# Patient Record
Sex: Female | Born: 1935 | Race: White | Hispanic: No | Smoking: Never smoker
Health system: Southern US, Community
[De-identification: ages and names within clinical notes are randomized; demographics above are authoritative.]

## PROBLEM LIST (undated history)

## (undated) DIAGNOSIS — D649 Anemia, unspecified: Secondary | ICD-10-CM

## (undated) DIAGNOSIS — M069 Rheumatoid arthritis, unspecified: Secondary | ICD-10-CM

## (undated) DIAGNOSIS — I10 Essential (primary) hypertension: Secondary | ICD-10-CM

## (undated) DIAGNOSIS — K429 Umbilical hernia without obstruction or gangrene: Secondary | ICD-10-CM

## (undated) DIAGNOSIS — H3530 Unspecified macular degeneration: Secondary | ICD-10-CM

## (undated) DIAGNOSIS — N200 Calculus of kidney: Secondary | ICD-10-CM

## (undated) DIAGNOSIS — E119 Type 2 diabetes mellitus without complications: Secondary | ICD-10-CM

## (undated) DIAGNOSIS — Q600 Renal agenesis, unilateral: Secondary | ICD-10-CM

## (undated) DIAGNOSIS — N289 Disorder of kidney and ureter, unspecified: Secondary | ICD-10-CM

## (undated) HISTORY — DX: Type 2 diabetes mellitus without complications: E11.9

## (undated) HISTORY — PX: OTHER SURGICAL HISTORY: SHX169

## (undated) HISTORY — DX: Renal agenesis, unilateral: Q60.0

## (undated) HISTORY — DX: Umbilical hernia without obstruction or gangrene: K42.9

## (undated) HISTORY — DX: Rheumatoid arthritis, unspecified: M06.9

## (undated) HISTORY — DX: Unspecified macular degeneration: H35.30

## (undated) HISTORY — DX: Disorder of kidney and ureter, unspecified: N28.9

## (undated) HISTORY — DX: Essential (primary) hypertension: I10

## (undated) HISTORY — DX: Anemia, unspecified: D64.9

## (undated) HISTORY — DX: Calculus of kidney: N20.0

---

## 2001-05-20 LAB — HM MAMMOGRAPHY

## 2004-05-27 ENCOUNTER — Ambulatory Visit: Payer: Self-pay | Admitting: Family Medicine

## 2004-07-29 ENCOUNTER — Ambulatory Visit: Payer: Self-pay | Admitting: Family Medicine

## 2004-09-16 ENCOUNTER — Ambulatory Visit: Payer: Self-pay | Admitting: Family Medicine

## 2004-11-03 ENCOUNTER — Ambulatory Visit: Payer: Self-pay | Admitting: Family Medicine

## 2004-12-17 ENCOUNTER — Ambulatory Visit: Payer: Self-pay | Admitting: Family Medicine

## 2004-12-29 ENCOUNTER — Ambulatory Visit: Payer: Self-pay | Admitting: Family Medicine

## 2005-02-17 ENCOUNTER — Ambulatory Visit: Payer: Self-pay | Admitting: Family Medicine

## 2005-05-06 ENCOUNTER — Ambulatory Visit: Payer: Self-pay | Admitting: Family Medicine

## 2005-12-02 ENCOUNTER — Ambulatory Visit: Payer: Self-pay | Admitting: Family Medicine

## 2005-12-03 ENCOUNTER — Encounter: Payer: Self-pay | Admitting: Family Medicine

## 2005-12-03 LAB — CONVERTED CEMR LAB: Hgb A1c MFr Bld: 6.1 %

## 2006-01-05 ENCOUNTER — Ambulatory Visit: Payer: Self-pay | Admitting: Family Medicine

## 2006-02-04 ENCOUNTER — Ambulatory Visit: Payer: Self-pay | Admitting: Family Medicine

## 2006-05-05 ENCOUNTER — Ambulatory Visit: Payer: Self-pay | Admitting: Family Medicine

## 2007-04-08 ENCOUNTER — Encounter: Payer: Self-pay | Admitting: Family Medicine

## 2007-04-08 DIAGNOSIS — E1140 Type 2 diabetes mellitus with diabetic neuropathy, unspecified: Secondary | ICD-10-CM

## 2007-04-08 DIAGNOSIS — H3530 Unspecified macular degeneration: Secondary | ICD-10-CM | POA: Insufficient documentation

## 2007-04-08 DIAGNOSIS — E119 Type 2 diabetes mellitus without complications: Secondary | ICD-10-CM

## 2007-04-08 DIAGNOSIS — Z87442 Personal history of urinary calculi: Secondary | ICD-10-CM

## 2007-04-08 DIAGNOSIS — M7500 Adhesive capsulitis of unspecified shoulder: Secondary | ICD-10-CM

## 2007-04-08 DIAGNOSIS — R32 Unspecified urinary incontinence: Secondary | ICD-10-CM | POA: Insufficient documentation

## 2007-04-08 DIAGNOSIS — E7800 Pure hypercholesterolemia, unspecified: Secondary | ICD-10-CM

## 2007-04-08 DIAGNOSIS — E1121 Type 2 diabetes mellitus with diabetic nephropathy: Secondary | ICD-10-CM

## 2007-04-08 DIAGNOSIS — I10 Essential (primary) hypertension: Secondary | ICD-10-CM

## 2007-04-08 DIAGNOSIS — M069 Rheumatoid arthritis, unspecified: Secondary | ICD-10-CM | POA: Insufficient documentation

## 2007-04-12 ENCOUNTER — Ambulatory Visit: Payer: Self-pay | Admitting: Family Medicine

## 2007-05-02 ENCOUNTER — Telehealth: Payer: Self-pay | Admitting: Family Medicine

## 2007-05-02 LAB — CONVERTED CEMR LAB
ALT: 15 units/L (ref 0–35)
AST: 18 units/L (ref 0–37)
CO2: 30 meq/L (ref 19–32)
Calcium: 9.4 mg/dL (ref 8.4–10.5)
Direct LDL: 185.6 mg/dL
Eosinophils Absolute: 0.2 10*3/uL (ref 0.0–0.6)
Eosinophils Relative: 3.3 % (ref 0.0–5.0)
Glucose, Bld: 156 mg/dL — ABNORMAL HIGH (ref 70–99)
HCT: 32.2 % — ABNORMAL LOW (ref 36.0–46.0)
Hemoglobin: 11.2 g/dL — ABNORMAL LOW (ref 12.0–15.0)
Lymphocytes Relative: 21.3 % (ref 12.0–46.0)
MCHC: 34.8 g/dL (ref 30.0–36.0)
MCV: 91.3 fL (ref 78.0–100.0)
Microalb, Ur: 0.6 mg/dL (ref 0.0–1.9)
Monocytes Absolute: 0.6 10*3/uL (ref 0.2–0.7)
Neutro Abs: 4.9 10*3/uL (ref 1.4–7.7)
Neutrophils Relative %: 66.4 % (ref 43.0–77.0)
Phosphorus: 4.6 mg/dL (ref 2.3–4.6)
Potassium: 4.6 meq/L (ref 3.5–5.1)
Sodium: 143 meq/L (ref 135–145)
WBC: 7 10*3/uL (ref 4.5–10.5)

## 2007-05-20 ENCOUNTER — Ambulatory Visit: Payer: Self-pay | Admitting: Family Medicine

## 2007-06-07 ENCOUNTER — Ambulatory Visit: Payer: Self-pay | Admitting: Family Medicine

## 2007-06-09 ENCOUNTER — Encounter (INDEPENDENT_AMBULATORY_CARE_PROVIDER_SITE_OTHER): Payer: Self-pay | Admitting: *Deleted

## 2007-07-05 ENCOUNTER — Ambulatory Visit: Payer: Self-pay | Admitting: Family Medicine

## 2007-07-05 DIAGNOSIS — D649 Anemia, unspecified: Secondary | ICD-10-CM

## 2007-07-06 LAB — CONVERTED CEMR LAB
Basophils Absolute: 0 10*3/uL (ref 0.0–0.1)
Eosinophils Absolute: 0.2 10*3/uL (ref 0.0–0.6)
Eosinophils Relative: 3.6 % (ref 0.0–5.0)
Folate: 11.5 ng/mL
Iron: 58 ug/dL (ref 42–145)
MCV: 90.7 fL (ref 78.0–100.0)
Platelets: 113 10*3/uL — ABNORMAL LOW (ref 150–400)
RBC: 3.45 M/uL — ABNORMAL LOW (ref 3.87–5.11)
RDW: 13.6 % (ref 11.5–14.6)
WBC: 4.7 10*3/uL (ref 4.5–10.5)

## 2007-07-07 ENCOUNTER — Ambulatory Visit: Payer: Self-pay | Admitting: Oncology

## 2007-08-17 LAB — CBC WITH DIFFERENTIAL (CANCER CENTER ONLY)
BASO%: 0.4 % (ref 0.0–2.0)
HCT: 32.2 % — ABNORMAL LOW (ref 34.8–46.6)
LYMPH#: 1.5 10*3/uL (ref 0.9–3.3)
MONO#: 0.5 10*3/uL (ref 0.1–0.9)
NEUT#: 3.1 10*3/uL (ref 1.5–6.5)
Platelets: 138 10*3/uL — ABNORMAL LOW (ref 145–400)
RDW: 12.7 % (ref 10.5–14.6)
WBC: 5.3 10*3/uL (ref 3.9–10.0)

## 2007-08-23 ENCOUNTER — Encounter: Payer: Self-pay | Admitting: Family Medicine

## 2007-08-23 ENCOUNTER — Ambulatory Visit: Payer: Self-pay | Admitting: Ophthalmology

## 2007-08-23 ENCOUNTER — Other Ambulatory Visit: Payer: Self-pay

## 2007-09-06 ENCOUNTER — Ambulatory Visit: Payer: Self-pay | Admitting: Ophthalmology

## 2007-10-12 ENCOUNTER — Ambulatory Visit: Payer: Self-pay | Admitting: Ophthalmology

## 2007-10-18 ENCOUNTER — Ambulatory Visit: Payer: Self-pay | Admitting: Ophthalmology

## 2007-11-11 ENCOUNTER — Ambulatory Visit: Payer: Self-pay | Admitting: Oncology

## 2007-11-14 ENCOUNTER — Encounter: Payer: Self-pay | Admitting: Family Medicine

## 2007-11-14 LAB — BASIC METABOLIC PANEL
BUN: 26 mg/dL — ABNORMAL HIGH (ref 6–23)
CO2: 23 mEq/L (ref 19–32)
Chloride: 107 mEq/L (ref 96–112)
Glucose, Bld: 86 mg/dL (ref 70–99)
Potassium: 4.4 mEq/L (ref 3.5–5.3)

## 2007-11-14 LAB — CBC WITH DIFFERENTIAL (CANCER CENTER ONLY)
BASO%: 0.4 % (ref 0.0–2.0)
EOS%: 3.6 % (ref 0.0–7.0)
LYMPH%: 29.9 % (ref 14.0–48.0)
MCV: 88 fL (ref 81–101)
MONO#: 0.5 10*3/uL (ref 0.1–0.9)
MONO%: 9.3 % (ref 0.0–13.0)
Platelets: 147 10*3/uL (ref 145–400)
RDW: 13.4 % (ref 10.5–14.6)
WBC: 5.2 10*3/uL (ref 3.9–10.0)

## 2007-11-22 ENCOUNTER — Ambulatory Visit: Payer: Self-pay | Admitting: Family Medicine

## 2007-11-22 DIAGNOSIS — R220 Localized swelling, mass and lump, head: Secondary | ICD-10-CM

## 2007-11-22 DIAGNOSIS — R221 Localized swelling, mass and lump, neck: Secondary | ICD-10-CM

## 2007-11-28 LAB — CONVERTED CEMR LAB
Albumin: 3.8 g/dL (ref 3.5–5.2)
Chloride: 109 meq/L (ref 96–112)
GFR calc Af Amer: 51 mL/min
GFR calc non Af Amer: 42 mL/min
Hgb A1c MFr Bld: 5.6 % (ref 4.6–6.0)
Potassium: 4.6 meq/L (ref 3.5–5.1)
TSH: 0.56 microintl units/mL (ref 0.35–5.50)
Total Protein: 6.8 g/dL (ref 6.0–8.3)

## 2008-04-16 ENCOUNTER — Ambulatory Visit: Payer: Self-pay | Admitting: Family Medicine

## 2008-05-16 ENCOUNTER — Ambulatory Visit: Payer: Self-pay | Admitting: Oncology

## 2008-05-17 ENCOUNTER — Encounter: Payer: Self-pay | Admitting: Family Medicine

## 2008-05-17 LAB — CBC WITH DIFFERENTIAL (CANCER CENTER ONLY)
Eosinophils Absolute: 0.2 10*3/uL (ref 0.0–0.5)
LYMPH%: 30.5 % (ref 14.0–48.0)
MCH: 31.5 pg (ref 26.0–34.0)
MCV: 91 fL (ref 81–101)
MONO%: 8.3 % (ref 0.0–13.0)
Platelets: 122 10*3/uL — ABNORMAL LOW (ref 145–400)
RBC: 3.65 10*6/uL — ABNORMAL LOW (ref 3.70–5.32)
RDW: 12.8 % (ref 10.5–14.6)

## 2008-06-06 ENCOUNTER — Ambulatory Visit: Payer: Self-pay | Admitting: Family Medicine

## 2008-06-07 LAB — CONVERTED CEMR LAB
AST: 21 units/L (ref 0–37)
BUN: 26 mg/dL — ABNORMAL HIGH (ref 6–23)
Chloride: 108 meq/L (ref 96–112)
Cholesterol: 195 mg/dL (ref 0–200)
Glucose, Bld: 133 mg/dL — ABNORMAL HIGH (ref 70–99)
Hgb A1c MFr Bld: 5.5 % (ref 4.6–6.0)
Phosphorus: 4.8 mg/dL — ABNORMAL HIGH (ref 2.3–4.6)
Potassium: 4.5 meq/L (ref 3.5–5.1)

## 2008-07-02 ENCOUNTER — Encounter: Payer: Self-pay | Admitting: Family Medicine

## 2008-07-26 ENCOUNTER — Telehealth (INDEPENDENT_AMBULATORY_CARE_PROVIDER_SITE_OTHER): Payer: Self-pay | Admitting: *Deleted

## 2008-07-26 ENCOUNTER — Encounter: Payer: Self-pay | Admitting: Family Medicine

## 2008-08-30 ENCOUNTER — Telehealth: Payer: Self-pay | Admitting: Internal Medicine

## 2008-09-07 ENCOUNTER — Ambulatory Visit: Payer: Self-pay | Admitting: Family Medicine

## 2008-09-11 LAB — CONVERTED CEMR LAB
ALT: 13 units/L (ref 0–35)
AST: 19 units/L (ref 0–37)
CO2: 29 meq/L (ref 19–32)
Chloride: 107 meq/L (ref 96–112)
Cholesterol: 187 mg/dL (ref 0–200)
Creatinine, Ser: 1.2 mg/dL (ref 0.4–1.2)
GFR calc Af Amer: 56 mL/min
Total CHOL/HDL Ratio: 4.9
Triglycerides: 106 mg/dL (ref 0–149)

## 2008-10-01 ENCOUNTER — Encounter: Payer: Self-pay | Admitting: Family Medicine

## 2008-12-05 ENCOUNTER — Ambulatory Visit: Payer: Self-pay | Admitting: Family Medicine

## 2008-12-05 DIAGNOSIS — I8390 Asymptomatic varicose veins of unspecified lower extremity: Secondary | ICD-10-CM

## 2009-03-14 ENCOUNTER — Encounter: Payer: Self-pay | Admitting: Family Medicine

## 2009-05-14 ENCOUNTER — Ambulatory Visit: Payer: Self-pay | Admitting: Family Medicine

## 2009-09-03 ENCOUNTER — Ambulatory Visit: Payer: Self-pay | Admitting: Family Medicine

## 2009-09-06 LAB — CONVERTED CEMR LAB
ALT: 11 units/L (ref 0–35)
Albumin: 3.7 g/dL (ref 3.5–5.2)
Basophils Relative: 0.8 % (ref 0.0–3.0)
CO2: 28 meq/L (ref 19–32)
Calcium: 9.2 mg/dL (ref 8.4–10.5)
Chloride: 107 meq/L (ref 96–112)
Cholesterol: 198 mg/dL (ref 0–200)
Creatinine,U: 201.6 mg/dL
Eosinophils Absolute: 0.2 10*3/uL (ref 0.0–0.7)
Eosinophils Relative: 3.9 % (ref 0.0–5.0)
Glucose, Bld: 141 mg/dL — ABNORMAL HIGH (ref 70–99)
HCT: 34.9 % — ABNORMAL LOW (ref 36.0–46.0)
Hemoglobin: 11.9 g/dL — ABNORMAL LOW (ref 12.0–15.0)
MCHC: 34.1 g/dL (ref 30.0–36.0)
MCV: 94.3 fL (ref 78.0–100.0)
Microalb, Ur: 1.5 mg/dL (ref 0.0–1.9)
Monocytes Absolute: 0.5 10*3/uL (ref 0.1–1.0)
Neutro Abs: 3.6 10*3/uL (ref 1.4–7.7)
Potassium: 4.4 meq/L (ref 3.5–5.1)
RBC: 3.7 M/uL — ABNORMAL LOW (ref 3.87–5.11)
TSH: 0.46 microintl units/mL (ref 0.35–5.50)
Total Protein: 7 g/dL (ref 6.0–8.3)
VLDL: 38.8 mg/dL (ref 0.0–40.0)

## 2009-09-09 ENCOUNTER — Telehealth: Payer: Self-pay | Admitting: Family Medicine

## 2009-09-09 ENCOUNTER — Ambulatory Visit: Payer: Self-pay | Admitting: Family Medicine

## 2009-09-09 LAB — CONVERTED CEMR LAB
Bilirubin Urine: NEGATIVE
Bilirubin Urine: NEGATIVE
Blood in Urine, dipstick: NEGATIVE
Blood in Urine, dipstick: NEGATIVE
Glucose, Urine, Semiquant: NEGATIVE
Glucose, Urine, Semiquant: NEGATIVE
Ketones, urine, test strip: NEGATIVE
Nitrite: NEGATIVE
Specific Gravity, Urine: 1.01
Urobilinogen, UA: 0.2
Urobilinogen, UA: 0.2
Yeast, UA: 0
pH: 6.5
pH: 6

## 2009-09-13 ENCOUNTER — Encounter: Payer: Self-pay | Admitting: Family Medicine

## 2009-09-24 ENCOUNTER — Ambulatory Visit: Payer: Self-pay | Admitting: Family Medicine

## 2009-09-24 DIAGNOSIS — N289 Disorder of kidney and ureter, unspecified: Secondary | ICD-10-CM | POA: Insufficient documentation

## 2009-09-24 LAB — CONVERTED CEMR LAB
Bacteria, UA: 0
Bilirubin Urine: NEGATIVE
Blood in Urine, dipstick: NEGATIVE
Casts: 0 /lpf
Ketones, urine, test strip: NEGATIVE
Nitrite: NEGATIVE
Protein, U semiquant: NEGATIVE
RBC / HPF: 0

## 2009-09-25 ENCOUNTER — Encounter: Payer: Self-pay | Admitting: Family Medicine

## 2009-09-25 LAB — CONVERTED CEMR LAB
Albumin: 3.9 g/dL (ref 3.5–5.2)
BUN: 21 mg/dL (ref 6–23)
Calcium: 9.2 mg/dL (ref 8.4–10.5)
Creatinine, Ser: 1.6 mg/dL — ABNORMAL HIGH (ref 0.4–1.2)
Glucose, Bld: 176 mg/dL — ABNORMAL HIGH (ref 70–99)
Phosphorus: 4.4 mg/dL (ref 2.3–4.6)
Potassium: 4.4 meq/L (ref 3.5–5.1)

## 2009-09-26 ENCOUNTER — Telehealth: Payer: Self-pay | Admitting: Family Medicine

## 2009-10-09 ENCOUNTER — Ambulatory Visit: Payer: Self-pay | Admitting: Family Medicine

## 2009-10-10 LAB — CONVERTED CEMR LAB: Creatinine, Ser: 1.5 mg/dL — ABNORMAL HIGH (ref 0.4–1.2)

## 2009-11-04 ENCOUNTER — Encounter: Payer: Self-pay | Admitting: Family Medicine

## 2009-11-05 ENCOUNTER — Encounter: Payer: Self-pay | Admitting: Cardiovascular Disease

## 2009-11-14 ENCOUNTER — Ambulatory Visit: Payer: Self-pay

## 2009-11-14 ENCOUNTER — Encounter: Payer: Self-pay | Admitting: Family Medicine

## 2009-11-14 ENCOUNTER — Encounter (INDEPENDENT_AMBULATORY_CARE_PROVIDER_SITE_OTHER): Payer: Self-pay | Admitting: Nephrology

## 2009-11-14 DIAGNOSIS — R600 Localized edema: Secondary | ICD-10-CM | POA: Insufficient documentation

## 2009-12-03 ENCOUNTER — Encounter: Payer: Self-pay | Admitting: Family Medicine

## 2010-04-30 ENCOUNTER — Ambulatory Visit: Payer: Self-pay | Admitting: Family Medicine

## 2010-05-26 ENCOUNTER — Ambulatory Visit: Payer: Self-pay | Admitting: Family Medicine

## 2010-05-27 LAB — CONVERTED CEMR LAB
AST: 20 units/L (ref 0–37)
Albumin: 3.8 g/dL (ref 3.5–5.2)
BUN: 29 mg/dL — ABNORMAL HIGH (ref 6–23)
Basophils Absolute: 0 10*3/uL (ref 0.0–0.1)
Chloride: 104 meq/L (ref 96–112)
Creatinine, Ser: 1.5 mg/dL — ABNORMAL HIGH (ref 0.4–1.2)
Eosinophils Absolute: 0.2 10*3/uL (ref 0.0–0.7)
GFR calc non Af Amer: 35.72 mL/min (ref >60)
Glucose, Bld: 186 mg/dL — ABNORMAL HIGH (ref 70–99)
HDL: 52.2 mg/dL (ref >39.00)
Hgb A1c MFr Bld: 5.9 % (ref 4.6–6.5)
Lymphocytes Relative: 26.8 % (ref 12.0–46.0)
MCHC: 35.3 g/dL (ref 30.0–36.0)
Monocytes Relative: 8.8 % (ref 3.0–12.0)
Neutrophils Relative %: 60.5 % (ref 43.0–77.0)
Phosphorus: 4 mg/dL (ref 2.3–4.6)
Platelets: 123 10*3/uL — ABNORMAL LOW (ref 150.0–400.0)
Potassium: 4.4 meq/L (ref 3.5–5.1)
RBC: 3.51 M/uL — ABNORMAL LOW (ref 3.87–5.11)
RDW: 13.9 % (ref 11.5–14.6)
TSH: 0.48 microintl units/mL (ref 0.35–5.50)
Total CHOL/HDL Ratio: 4
Triglycerides: 142 mg/dL (ref 0.0–149.0)
VLDL: 28.4 mg/dL (ref 0.0–40.0)

## 2010-06-16 ENCOUNTER — Encounter: Payer: Self-pay | Admitting: Family Medicine

## 2010-08-20 NOTE — Assessment & Plan Note (Signed)
Summary: F/U,REFILL MEDS/CLE   Vital Signs:  Patient profile:   77 year old female Height:      62 inches Weight:      192.50 pounds BMI:     35.34 Temp:     98.1 degrees F oral Pulse rate:   80 / minute Pulse rhythm:   regular BP sitting:   126 / 68  (left arm) Cuff size:   large  Vitals Entered By: Lewanda Rife LPN (May 26, 2010 2:11 PM) CC: refill meds   History of Present Illness: here for f/u of HTN and renal insuff/ cholesterol   has been feeling ok - overall , nothing new   has seen renal and now cr is down to 1/28 gfr is 40- so recommended she could re start the metformin  has small L kidney and likely some vasc renal dz she started back on that  she saw Dr Thedore Mins -- and they did not like him -- unprofessional office conduct  she refuses to go back there  if she has to go somewhere else it would have to be [place]   has checked her sugars 2-3 times -- and they were    eats whatever she fixes - does not really watch diet because she does not have the time or energy to do so    has anemia of chronic kidney dz and they are watching that   HTN not well controlled there-- apprehensive to inc med to to orthostasis bp is much better today, however   april nl eye exam [facility]      Allergies: 1)  ! Metformin Hcl 2)  ! 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 Eyes:  Denies blurring and eye irritation. ENT:  Denies earache and postnasal drainage. CV:  Denies chest pain or discomfort, lightheadness, palpitations, and shortness of breath with exertion. Resp:  Denies cough, shortness of breath, and wheezing. GI:  Denies abdominal pain, change in bowel habits, indigestion, nausea, and vomiting. GU:  Denies dysuria and urinary frequency. MS:  Complains of joint pain and low back pain; denies muscle aches and cramps. Derm:  Denies itching, lesion(s), poor wound healing, and rash. Neuro:  Complains of tingling; denies numbness. Psych:  very stressed caring for her husband . Endo:  Denies cold intolerance, excessive thirst, excessive urination, and heat intolerance. Heme:  Denies abnormal bruising and bleeding.  Physical Exam  General:  obese and well appearing  Head:  normocephalic, atraumatic, and no abnormalities observed.   Eyes:  vision grossly intact, pupils equal, pupils round, and  pupils reactive to light.  no conjunctival pallor, injection or icterus  Nose:  no nasal discharge.   Mouth:  pharynx pink and moist.   Neck:  neck mass on R is larger and slt tender no other acute changes no JVD or bruits  Chest Wall:  No deformities, masses, or tenderness noted. Lungs:  Normal respiratory effort, chest expands symmetrically. Lungs are clear to auscultation, no crackles or wheezes. Heart:  Normal rate and regular rhythm. S1 and S2 normal without gallop, murmur, click, rub or other extra sounds. Abdomen:  protuberant soft/nt and no renal bruits  Pulses:   R and L carotid,radial,femoral,dorsalis pedis and posterior tibial pulses are full and equal bilaterally Extremities:  No clubbing, cyanosis, edema, or deformity noted with normal full range of motion of all joints.   Neurologic:  sensation intact to light touch, gait normal, and DTRs symmetrical and normal.   Skin:  Intact without suspicious lesions or rashes Cervical Nodes:  no changes  Psych:  normal affect, talkative and pleasant   Diabetes Management Exam:    Foot Exam (with socks and/or shoes not present):       Sensory-Pinprick/Light touch:          Left medial foot (L-4): normal          Left dorsal foot (L-5): normal          Left lateral foot (S-1): normal          Right medial foot (L-4): normal          Right dorsal foot (L-5): normal          Right lateral foot (S-1): normal       Sensory-Monofilament:          Left foot: normal          Right foot: normal       Inspection:          Left foot: normal          Right foot: normal       Nails:          Left foot: normal          Right foot: normal    Eye Exam:       Eye Exam done elsewhere          Date: 10/18/2009          Results: normal          Done by: [facility]   Impression & Recommendations:  Problem # 1:  EDEMA (ICD-782.3) Assessment Improved  pt is back on just 12.5 of hct and doing well - was ok'd by renal  lab today Her updated medication list for this problem includes:    Hyzaar 100-12.5 Mg Tabs (Losartan potassium-hctz) .Marland Kitchen... 1 by mouth once daily  Discussed elevation of the legs, use of compression stockings, sodium restiction, and medication use.   Problem # 2:  RENAL INSUFFICIENCY (ICD-588.9) Assessment: Improved was put back on metformin due to imp gfr pt , however does not want to return to Dr Thedore Mins  if cr rises again - req ref in [place] -- that is ok  det that renal dz is age rel and also poss reno- vasc dz, also congenital small kidney lab today Orders: Venipuncture  (29562) TLB-Lipid Panel (80061-LIPID) TLB-Renal Function Panel (80069-RENAL) TLB-CBC Platelet - w/Differential (85025-CBCD) TLB-ALT (SGPT) (84460-ALT) TLB-AST (SGOT) (84450-SGOT) TLB-TSH (Thyroid Stimulating Hormone) (84443-TSH) TLB-A1C / Hgb A1C (Glycohemoglobin) (83036-A1C)  Problem #  3:  UNSPECIFIED ANEMIA (ICD-285.9) Assessment: Unchanged from kidney dz lab today Her updated medication list for this problem includes:    Folic Acid 1 Mg Tabs (Folic acid) .Marland Kitchen... 1 by mouth once daily  Orders: Venipuncture (86578) TLB-Lipid Panel (80061-LIPID) TLB-Renal Function Panel (80069-RENAL) TLB-CBC Platelet - w/Differential (85025-CBCD) TLB-ALT (SGPT) (84460-ALT) TLB-AST (SGOT) (84450-SGOT) TLB-TSH (Thyroid Stimulating Hormone) (84443-TSH) TLB-A1C / Hgb A1C (Glycohemoglobin) (83036-A1C)  Problem # 4:  DIABETES MELLITUS, TYPE II (ICD-250.00) Assessment: Unchanged hope for imp back on metformin enc pt to eat healthy diet and find time to check sugar once weekly she explains she simply cannot caring for demented husband lab today f/u 6 mo  opthy utd  Her updated medication list for this problem includes:    Hyzaar 100-12.5 Mg Tabs (Losartan potassium-hctz) .Marland Kitchen... 1 by mouth once daily    Metformin Hcl 500 Mg Tabs (Metformin hcl) .Marland Kitchen..Marland Kitchen Two tablets by mouth twice a day  Orders: Venipuncture (46962) TLB-Lipid Panel (80061-LIPID) TLB-Renal Function Panel (80069-RENAL) TLB-CBC Platelet - w/Differential (85025-CBCD) TLB-ALT (SGPT) (84460-ALT) TLB-AST (SGOT) (84450-SGOT) TLB-TSH (Thyroid Stimulating Hormone) (84443-TSH) TLB-A1C / Hgb A1C (Glycohemoglobin) (83036-A1C)  Problem # 5:  HYPERTENSION (ICD-401.9) Assessment: Improved  bp is much imp today -- continue to follow (was high in renal office) Her updated medication list for this problem includes:    Hyzaar 100-12.5 Mg Tabs (Losartan potassium-hctz) .Marland Kitchen... 1 by mouth once daily  Orders: Venipuncture (95284) TLB-Lipid Panel  (80061-LIPID) TLB-Renal Function Panel (80069-RENAL) TLB-CBC Platelet - w/Differential (85025-CBCD) TLB-ALT (SGPT) (84460-ALT) TLB-AST (SGOT) (84450-SGOT) TLB-TSH (Thyroid Stimulating Hormone) (84443-TSH) TLB-A1C / Hgb A1C (Glycohemoglobin) (83036-A1C)  BP today: 126/68 Prior BP: 118/64 (10/09/2009)  Labs Reviewed: K+: 4.4 (09/24/2009) Creat: : 1.5 (10/09/2009)   Chol: 198 (09/03/2009)   HDL: 45.20 (09/03/2009)   LDL: 114 (09/03/2009)   TG: 194.0 (09/03/2009)  Complete Medication List: 1)  Gabapentin 300 Mg Caps (Gabapentin) .... Take two capsules two  times a day 2)  Zetia 10 Mg Tabs (Ezetimibe) .Marland Kitchen.. 1 by mouth once daily 3)  Detrol La 4 Mg Cp24 (Tolterodine tartrate) .Marland Kitchen.. 1 by mouth once daily 4)  Folic Acid 1 Mg Tabs (Folic acid) .Marland Kitchen.. 1 by mouth once daily 5)  Advil 200 Mg Caps (Ibuprofen) .... Once daily 6)  Contour Blood Glucose System Devi (Blood glucose monitoring suppl) .... To check glucose once daily and as needed 7)  Bayer Contour Test Strp (Glucose blood) .... Check blood sugar once daily and as needed dx: 250.52 8)  B Complex Vitamins Caps (B complex vitamins) .... Once daily 9)  Hyzaar 100-12.5 Mg Tabs (Losartan potassium-hctz) .Marland Kitchen.. 1 by mouth once daily 10)  Metformin Hcl 500 Mg Tabs (Metformin hcl) .... Two tablets by mouth twice a day 11)  Vitamin D 2000 Unit Tabs (Cholecalciferol) .... Take 1 tablet by mouth once a day 12)  Iron ?mg  .... Take 1 tablet by mouth once a day  Patient Instructions: 1)  try to stick to a diabetic diet the best you can  2)  check sugar once per week at different times 3)  ask for help caring for your husband when needed  4)  labs today  5)  no change in medicines  6)  blood pressure is better today 7)  follow up in about 6 months  Prescriptions: HYZAAR 100-12.5 MG TABS (LOSARTAN POTASSIUM-HCTZ) 1 by mouth once daily  #90 x 3   Entered and Authorized by:   Judith Part MD   Signed by:   [employer]  Rose Fillers MD on 05/26/2010   Method  used:   Print then Give to Patient   RxID:   [id number] METFORMIN HCL 500 MG TABS (METFORMIN HCL) two tablets by mouth twice a day  #3 months x 3   Entered and Authorized by:   Judith Part MD   Signed by:   Judith Part MD on 05/26/2010   Method used:   Print then Give to Patient   RxID:   [id number] COZAAR 100 MG TABS (LOSARTAN POTASSIUM) take one tablet by mouth daily  #90 x 3   Entered and Authorized by:   Judith Part MD   Signed by:   Judith Part MD on 05/26/2010   Method used:   Print then Give to Patient   RxID:   [id number] FOLIC ACID 1 MG  TABS (FOLIC ACID) 1 by mouth once daily  #90 x 3   Entered and Authorized by:   Judith Part MD   Signed by:   Judith Part MD on 05/26/2010   Method used:   Print then Give to Patient   RxID:   [id number] DETROL LA 4 MG  CP24 (TOLTERODINE TARTRATE) 1 by mouth once daily  #90 x 3   Entered and Authorized by:   Judith Part MD   Signed by:   Judith Part MD on 05/26/2010   Method used:   Print then Give to Patient   RxID:   [id number] ZETIA 10 MG  TABS (EZETIMIBE) 1 by mouth once daily  #90 x 3   Entered and Authorized by:   Judith Part MD   Signed by:   Judith Part MD on 05/26/2010   Method used:   Print then Give to Patient   RxID:   [id number] GABAPENTIN 300 MG CAPS (GABAPENTIN) take two capsules two  times a day  #3 months x 3   Entered and Authorized by:   Judith Part MD   Signed by:   Judith Part MD on 05/26/2010   Method used:   Print then Give to Patient   RxID:   [id number]    Orders Added: 1)  Venipuncture [70623] 2)  TLB-Lipid Panel [80061-LIPID] 3)  TLB-Renal Function Panel [80069-RENAL] 4)  TLB-CBC Platelet - w/Differential [85025-CBCD] 5)  TLB-ALT (SGPT) [84460-ALT] 6)  TLB-AST (SGOT) [84450-SGOT] 7)  TLB-TSH (Thyroid Stimulating Hormone) [84443-TSH] 8)  TLB-A1C / Hgb A1C (Glycohemoglobin) [83036-A1C] 9)  Est. Patient Level IV  [76283]    Current Allergies (reviewed today): ! METFORMIN HCL ! * GLIPIZIDE LOPRESSOR ACE INHIBITORS * DETROL LIPITOR DARVOCET LODINE * CRESTOR

## 2010-08-20 NOTE — Assessment & Plan Note (Signed)
Summary: urine sample/rbh  Nurse Visit   Allergies: 1)  Lopressor 2)  Ace Inhibitors 3)  * Detrol 4)  Lipitor 5)  Darvocet 6)  Lodine 7)  * Crestor Laboratory Results   Urine Tests  Date/Time Received: September 09, 2009 3:15 PM  Date/Time Reported: September 09, 2009 3:15 PM   Routine Urinalysis   Color: yellow Appearance: slightly hazy Glucose: negative   (Normal Range: Negative) Bilirubin: negative   (Normal Range: Negative) Ketone: negative   (Normal Range: Negative) Spec. Gravity: 1.010   (Normal Range: 1.003-1.035) Blood: negative   (Normal Range: Negative) pH: 6.5   (Normal Range: 5.0-8.0) Protein: trace   (Normal Range: Negative) Urobilinogen: 0.2   (Normal Range: 0-1) Nitrite: negative   (Normal Range: Negative) Leukocyte Esterace: small   (Normal Range: Negative)       Orders Added: 1)  Specimen Handling [99000]   Dr. Milinda Antis requested pt to have a urinalysis done from lab results note on 09/03/09. Creatinine was up and pt has been drinking more water. Can call pt's daughter Ian Malkin [phone] with results of u/a. Lewanda Rife LPN  September 09, 2009 3:19 PM   Did phone note to Dr Milinda Antis. Dr. Milinda Antis sending for urine culture and will notify Ms. Newsome upon results.Patient's daughter Ian Malkin notified as instructed by telephone. Lewanda Rife LPN  September 09, 2009 4:33 PM

## 2010-08-20 NOTE — Miscellaneous (Signed)
Summary: Glipizide 10mg  XR update  Clinical Lists Changes  Medications: Changed medication from GLIPIZIDE 10 MG XR24H-TAB (GLIPIZIDE) take one tablet by mouth daily to GLIPIZIDE 10 MG XR24H-TAB (GLIPIZIDE) take one tablet by mouth  once daily Observations: Added new observation of MEDS REVIEW: Done (09/25/2009 10:59)     Current Allergies: LOPRESSOR ACE INHIBITORS * DETROL LIPITOR DARVOCET LODINE * CRESTOR

## 2010-08-20 NOTE — Consult Note (Signed)
Summary: [facility]   Imported By: Lanelle Bal 11/07/2009 08:12:52  _____________________________________________________________________  External Attachment:    Type:   Image     Comment:   External Document

## 2010-08-20 NOTE — Progress Notes (Signed)
Summary: blood sugar  Phone Note Call from Patient Call back at Home Phone [phone]   Caller: Daughter Call For: Kristina Bennett Summary of Call: Bonita Quin says that last night before going to bed patient's blood sugar was 190. This morning it was 106 and she ate egg, toast, and coffee. Around 12:00 it was 59 and she felt shaky so she ate cake and coffee. Then at 2:00 it was 164. Daughter wants to know if they can cut the glipizide in half so that she is only taking 5 mg to avoid her having that low. Please advise.  Initial call taken by: Melody Comas,  September 26, 2009 2:20 PM  Follow-up for Phone Call        yes - take just 1/2 of the pill and let me know if not imp  I appreciate the update Follow-up by: Kristina Bennett,  September 26, 2009 7:47 PM  Additional Follow-up for Phone Call Additional follow up Details #1::        Advised pt's daughter, Bonita Quin. Additional Follow-up by: Lowella Petties CMA,  September 27, 2009 9:03 AM    New/Updated Medications: GLIPIZIDE 10 MG XR24H-TAB (GLIPIZIDE) take 1/2  tablet by mouth  once daily

## 2010-08-20 NOTE — Assessment & Plan Note (Signed)
Summary: FOLLOW UP IN 2 WEEKS PER DR Indica Marcott/[place]   Vital Signs:  Patient profile:   77 year old female Height:      62 inches Weight:      189.25 pounds BMI:     34.74 Temp:     97.6 degrees F oral Pulse rate:   88 / minute Pulse rhythm:   regular BP sitting:   118 / 64  (left arm) Cuff size:   large  Vitals Entered By: Lewanda Rife LPN (October 09, 2009 2:54 PM) CC: two week followup   History of Present Illness: here for f/u of DM with some med changes  had to stop her metformin for climbing Cr -was 1.6 last check started on glipizide and had to end up cutting dose in 1/2 due to hypoglycemia  this is still too strong - and now not taking anything at all   overall am sugars are mid 120s or lower  one was 222 - after late spaghetti supper  in pms - usually does stay below 140s- with a few exceptions   wt same-- is paying more attn to diet   bp good   does not eat a lot of salt   hands and wrists are burning more at night (feet used to burn)  thinks this is from DM neuropathy gabapentin 600 helps at night - but needs more of it    Allergies: 1)  ! Metformin Hcl 2)  ! 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 Eyes:  Denies blurring and eye irritation. CV:  Denies chest pain or discomfort and lightheadness. Resp:  Denies cough and wheezing. GI:  Denies abdominal pain, change in bowel habits, and indigestion. GU:  Denies dysuria, hematuria, urinary frequency, and urinary hesitancy. MS:  Denies cramps and muscle weakness. Derm:  Denies itching, lesion(s), poor wound healing, and rash. Neuro:  Complains of tingling; denies weakness. Endo:  Denies cold intolerance, excessive thirst, excessive urination, and heat intolerance. Heme:  Denies abnormal bruising and bleeding.  Physical Exam  General:  overweight but generally well appearing  Head:  normocephalic, atraumatic, and no abnormalities observed.   Mouth:  MMM Neck:  neck mass on R is larger and slt tender no other acute changes no JVD or bruits  Lungs:  Normal respiratory effort, chest expands symmetrically. Lungs are clear to auscultation, no crackles or wheezes. Heart:  Normal rate and regular rhythm. S1 and S2 normal without gallop, murmur, click, rub or other extra sounds. Abdomen:  obese abd  baseline umbilical hernia  no suprapubic tenderness or fullness felt  Msk:  no CVA tenderness  Pulses:  R and L carotid,radial,femoral,dorsalis pedis and posterior tibial pulses are full and equal bilaterally Extremities:  No  clubbing, cyanosis, edema, or deformity noted with normal full range of motion of all joints.   Neurologic:  pos tinel's sign both wrists causing burning in hands neg phalen nl grip strength and sens to light touch Skin:  Intact without suspicious lesions or rashes Cervical Nodes:  No lymphadenopathy noted Psych:  normal affect, talkative and pleasant    Impression & Recommendations:  Problem # 1:  RENAL INSUFFICIENCY (ICD-588.9) Assessment Deteriorated recently d/c metformin so hope for imp is keeping water intake up check bun /cr suspect  multifactorial  if still high may need to further eval med and consider nephrology consult Orders: Venipuncture ([address]) TLB-BUN (Urea Nitrogen) (84520-BUN) TLB-Creatinine, Blood (82565-CREA)  Problem # 2:  Hx of DIABETIC PERIPHERAL NEUROPATHY (ICD-250.60) Assessment: Deteriorated more trouble with hands - which may actually have a component of carpal tunnel asked to try wrist splints at night and update before inc her gabapentin The following medications were removed from the medication list:    Glipizide 10 Mg Xr24h-tab (Glipizide) .Marland Kitchen... Take 1/2  tablet by mouth  once daily Her updated medication list for this problem includes:    Hyzaar 100-12.5 Mg Tabs (Losartan potassium-hctz) ..... One by mouth once daily  Problem # 3:  DIABETES MELLITUS, TYPE II (ICD-250.00) Assessment: Unchanged  sugars are actually not bad off all med (intol glipizide) with good diet disc DM diet- continue that - and will monitor  The following medications were removed from the medication list:    Glipizide 10 Mg Xr24h-tab (Glipizide) .Marland Kitchen... Take 1/2  tablet by mouth  once daily Her updated medication list for this problem includes:    Hyzaar 100-12.5 Mg Tabs (Losartan potassium-hctz) ..... One by mouth once daily  Labs Reviewed: Creat: 1.6 (09/24/2009)     Last Eye Exam: normal (11/12/2008) Reviewed HgBA1c results: 5.8 (09/03/2009)  5.5 (06/06/2008)  Complete Medication List: 1)  Gabapentin 300 Mg Caps (Gabapentin) .... Take two capsules two  times a day 2)  Hyzaar 100-12.5 Mg Tabs (Losartan potassium-hctz) .... One by mouth once daily 3)  Zetia 10 Mg Tabs (Ezetimibe) .Marland Kitchen.. 1 by mouth once daily 4)  Detrol La 4 Mg Cp24 (Tolterodine tartrate) .Marland Kitchen.. 1 by mouth once daily 5)  Folic Acid 1 Mg Tabs (Folic acid) .Marland Kitchen.. 1 by mouth once daily 6)  Advil 200 Mg Caps (Ibuprofen) .... Once daily 7)  Contour Blood Glucose System Devi (Blood glucose monitoring suppl) .... To check glucose once daily and as needed 8)   Bayer Contour Test Strp (Glucose blood) .... Check blood sugar once daily and as needed dx: 250.52 9)  B Complex Vitamins Caps (B complex vitamins) .... Once daily  Patient Instructions: 1)  get some carpal tunnel wrist splints at the drug store and wear them at night  2)  if your hand burning is still bad - call so I can increase your gabapentin dose  3)  checking kidney function today  4)  no diabetic medicine at all now- so follow diabetic diet -- watch your carbohydrate intake  5)  I will update you with  a plan when I get your labs back  Current Allergies (reviewed today): ! METFORMIN HCL ! * GLIPIZIDE LOPRESSOR ACE INHIBITORS * DETROL LIPITOR DARVOCET LODINE * CRESTOR

## 2010-08-20 NOTE — Consult Note (Signed)
Summary: [facility]   Imported By: Lanelle Bal 12/10/2009 09:56:57  _____________________________________________________________________  External Attachment:    Type:   Image     Comment:   External Document

## 2010-08-20 NOTE — Miscellaneous (Signed)
Summary: Cipro 250mg  rx  Clinical Lists Changes  Medications: Added new medication of CIPRO 250 MG TABS (CIPROFLOXACIN HCL) take one tablet by mouth two times a day for seven days - Signed Rx of CIPRO 250 MG TABS (CIPROFLOXACIN HCL) take one tablet by mouth two times a day for seven days;  #14 x 0;  Signed;  Entered by: Lewanda Rife LPN;  Authorized by: Judith Part MD;  Method used: Electronically to [facility]*, [address], [place], [place]  [address], Ph: [phone], Fax: [phone]    Prescriptions: CIPRO 250 MG TABS (CIPROFLOXACIN HCL) take one tablet by mouth two times a day for seven days  #14 x 0   Entered by:   Lewanda Rife LPN   Authorized by:   Judith Part MD   Signed by:   Lewanda Rife LPN on 13/02/6577   Method used:   Electronically to        [facility]* (retail)       [address]       [place], [place]  [address]       Ph: [phone]       Fax: [phone]   RxID:   [id number]  Pt's daughter Bonita Quin will pick up rx on Sat. and will call back to make f/u appts.Lewanda Rife LPN  September 13, 2009 5:09 PM  Current Allergies: LOPRESSOR ACE INHIBITORS * DETROL LIPITOR DARVOCET LODINE * CRESTOR

## 2010-08-20 NOTE — Letter (Signed)
Summary: ECHO Referral  ECHO Referral   Imported By: Harlon Flor 11/13/2009 11:16:06  _____________________________________________________________________  External Attachment:    Type:   Image     Comment:   External Document

## 2010-08-20 NOTE — Assessment & Plan Note (Signed)
Summary: F/U,REFILL MEDS/CLE   Vital Signs:  Patient profile:   77 year old female Height:      62 inches Weight:      188.25 pounds BMI:     34.56 Temp:     97.6 degrees F oral Pulse rate:   84 / minute Pulse rhythm:   regular BP sitting:   130 / 64  (left arm) Cuff size:   regular  Vitals Entered By: Lewanda Rife LPN (September 03, 2009 2:02 PM)  History of Present Illness: here for f/u of chronic med problems incl DM, renal insuff, HTN and lipids   is feeling ok  all is same  still a lot of stress at home caring for her husband with alz  had flu shot and pneumovax ? shingles status   wt is up 6 lb with bmi of 34  diet is fair - about the same  - no way to eat a diabetic diet  family cannot help  does the best she can   is due for her labs   cholesterol - intol to most meds is on zetia   no change in medicines  occ gets some cramps - / if needs k  takes gabapentin -- takes 2 (300 mg) two times a day -- cannot swallow the 600- so need to change that  needs new px to send to [facility]   opthy is due march -- has appt already still has floaters in her eyes       Allergies: 1)  Lopressor 2)  Ace Inhibitors 3)  * Detrol 4)  Lipitor 5)  Darvocet 6)  Lodine 7)  * Crestor  Past History:  Past Medical History: Last updated: 06/06/2008 Diabetes mellitus, type II Hypertension Nephrolithiasis, hx of Rheumatoid arthritis Urinary incontinence salivary gland mass R umbilical hernia anemia  R submandibular neck mass  macular degeneration  heme--Dr [facility]   Past Surgical History: Last updated: 06/06/2008 cataract sx  Family History: Last updated: 04/08/2007 Father: prostate cancer, suicide Mother: deceased age 64- [place] Siblings: 1 brother, 1 sister  Social History: Last updated: 06/06/2008 Marital Status: Married Children: 5 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 Eyes:  Denies eye irritation. ENT:  lump in neck is a bit bigger - does not bother her enough to have surgery yet - watching this with ENt. CV:  Denies chest pain or discomfort, palpitations, shortness of breath with exertion, and swelling of feet. Resp:  Denies cough and shortness of breath. GI:  Denies abdominal pain, bloody stools, change in bowel habits, indigestion, and nausea. MS:  Denies joint pain, joint redness, and joint swelling. Derm:  Denies lesion(s), poor wound healing, and rash. Neuro:  Denies numbness and tingling. Psych:  Complains of anxiety and depression. Endo:  Denies cold intolerance and excessive thirst. Heme:  Denies abnormal bruising and bleeding.  Physical Exam  General:  overweight but generally well appearing  Head:  normocephalic, atraumatic, and no abnormalities observed.   Eyes:  vision grossly intact, pupils equal, pupils round, and pupils reactive to light.  no conjunctival pallor, injection or icterus  Nose:  no nasal discharge.   Mouth:  pharynx pink and moist.   Neck:  neck mass on R is larger and slt tender no other acute changes no JVD or bruits  Chest Wall:  No deformities,  masses, or tenderness noted. Lungs:  Normal respiratory effort, chest expands symmetrically. Lungs are clear to auscultation, no crackles or wheezes. Heart:  Normal rate and regular rhythm. S1 and S2 normal without gallop, murmur, click, rub or other extra sounds. Abdomen:  Bowel sounds positive,abdomen soft and non-tender without masses, organomegaly or hernias noted. umbilical hernia is larger but reducible and nontender  Msk:  No deformity or scoliosis noted of thoracic or lumbar spine.   Pulses:  R and L carotid,radial,femoral,dorsalis pedis and posterior tibial pulses are full and equal bilaterally Extremities:  No clubbing, cyanosis, edema, or deformity noted with normal full range of motion  of all joints.   Neurologic:  sensation intact to light touch, gait normal, and DTRs symmetrical and normal.   Skin:  Intact without suspicious lesions or rashes Cervical Nodes:  No lymphadenopathy noted Inguinal Nodes:  No significant adenopathy Psych:  quiet and timid- baseline   Diabetes Management Exam:    Foot Exam (with socks and/or shoes not present):       Sensory-Pinprick/Light touch:          Left medial foot (L-4): normal          Left dorsal foot (L-5): normal          Left lateral foot (S-1): normal          Right medial foot (L-4): normal          Right dorsal foot (L-5): normal          Right lateral foot (S-1): normal       Sensory-Monofilament:          Left foot: normal          Right foot: normal       Inspection:          Left foot: normal          Right foot: normal       Nails:          Left foot: normal          Right foot: normal   Impression & Recommendations:  Problem # 1:  Hx of HYPERCHOLESTEROLEMIA (ICD-272.0) Assessment Unchanged  not at goal /does not tol statin  in fairl control with zetia and diet  lab and update rev low sat fat diet  Her updated medication list for this problem includes:    Zetia 10 Mg Tabs (Ezetimibe) .Marland Kitchen... 1 by mouth once daily  Orders: Venipuncture (44010) TLB-Lipid Panel (80061-LIPID) TLB-Renal Function Panel (80069-RENAL) TLB-CBC Platelet - w/Differential (85025-CBCD) TLB-Hepatic/Liver Function Pnl (80076-HEPATIC) TLB-TSH (Thyroid Stimulating Hormone) (84443-TSH) TLB-A1C / Hgb A1C (Glycohemoglobin) (83036-A1C)  Labs Reviewed: SGOT: 19 (09/07/2008)   SGPT: 13 (09/07/2008)   HDL:38.1 (09/07/2008), 44.5 (06/06/2008)  LDL:128 (09/07/2008), 128 (06/06/2008)  Chol:187 (09/07/2008), 195 (06/06/2008)  Trig:106 (09/07/2008), 112 (06/06/2008)  Problem # 2:  HYPERTENSION (ICD-401.9) Assessment: Unchanged  bp continues to be well controlled with hyzaar - no change lab and update Her updated medication list for this  problem includes:    Hyzaar 100-12.5 Mg Tabs (Losartan potassium-hctz) ..... One by mouth once daily  Orders: Venipuncture (27253) TLB-Lipid Panel (80061-LIPID) TLB-Renal Function Panel (80069-RENAL) TLB-CBC Platelet - w/Differential (85025-CBCD) TLB-Hepatic/Liver Function Pnl (80076-HEPATIC) TLB-TSH (Thyroid Stimulating Hormone) (84443-TSH) TLB-A1C / Hgb A1C (Glycohemoglobin) (83036-A1C)  BP today: 130/64 Prior BP: 124/60 (12/05/2008)  Labs Reviewed: K+: 4.3 (09/07/2008) Creat: : 1.2 (09/07/2008)   Chol: 187 (09/07/2008)   HDL: 38.1 (09/07/2008)   LDL: 128 (09/07/2008)   TG:  106 (09/07/2008)  Problem # 3:  DIABETES MELLITUS, TYPE II (ICD-250.00) Assessment: Unchanged  with stable home sugars  cannot follow DM diet due to caring for husb with alz and no help from family disc healthy diet (low simple sugar/ choose complex carbs/ low sat fat) diet and exercise in detail  lab today and adv  opthy appt next mo  Her updated medication list for this problem includes:    Hyzaar 100-12.5 Mg Tabs (Losartan potassium-hctz) ..... One by mouth once daily    Glucophage 500 Mg Tabs (Metformin hcl) .Marland Kitchen... 2 by mouth q am, 2 by mouth q pm  Orders: Venipuncture (47829) TLB-Lipid Panel (80061-LIPID) TLB-Renal Function Panel (80069-RENAL) TLB-CBC Platelet - w/Differential (85025-CBCD) TLB-Hepatic/Liver Function Pnl (80076-HEPATIC) TLB-TSH (Thyroid Stimulating Hormone) (84443-TSH) TLB-A1C / Hgb A1C (Glycohemoglobin) (83036-A1C) TLB-Microalbumin/Creat Ratio, Urine (82043-MALB)  Labs Reviewed: Creat: 1.2 (09/07/2008)     Last Eye Exam: normal (11/12/2008) Reviewed HgBA1c results: 5.5 (06/06/2008)  5.6 (11/22/2007)  Problem # 4:  NECK MASS (ICD-784.2) Assessment: Comment Only is slt larger- pt following with her ENT- does not choose surgery at this time  Complete Medication List: 1)  Gabapentin 300 Mg Caps (Gabapentin) .... Take two capsules two  times a day 2)  Hyzaar 100-12.5 Mg Tabs  (Losartan potassium-hctz) .... One by mouth once daily 3)  Glucophage 500 Mg Tabs (Metformin hcl) .... 2 by mouth q am, 2 by mouth q pm 4)  Ascencia Contour Glucometer  .... To check glucose once daily and prn 5)  Ascencia Contour Glucose Test Strips and Lancets  .... To check glucose once daily and as needed for dm 250.52 6)  Zetia 10 Mg Tabs (Ezetimibe) .Marland Kitchen.. 1 by mouth once daily 7)  Detrol La 4 Mg Cp24 (Tolterodine tartrate) .Marland Kitchen.. 1 by mouth once daily 8)  Folic Acid 1 Mg Tabs (Folic acid) .Marland Kitchen.. 1 by mouth once daily 9)  Vitamin B  .... Daily 10)  Advil 200 Mg Caps (Ibuprofen) .... .daily as needed  Patient Instructions: 1)  no change in medications  2)  try to stay as active as possible  3)  labs today  4)  follow up with your eye doctor as planned  5)  if you are interested in shingles vaccine in future - check with your insurance and then call in summer to schedule shot  Prescriptions: FOLIC ACID 1 MG  TABS (FOLIC ACID) 1 by mouth once daily  #90 x 3   Entered and Authorized by:   Judith Part MD   Signed by:   Judith Part MD on 09/03/2009   Method used:   Print then Give to Patient   RxID:   [id number] DETROL LA 4 MG  CP24 (TOLTERODINE TARTRATE) 1 by mouth once daily  #90 x 3   Entered and Authorized by:   Judith Part MD   Signed by:   Judith Part MD on 09/03/2009   Method used:   Print then Give to Patient   RxID:   [id number] ZETIA 10 MG  TABS (EZETIMIBE) 1 by mouth once daily  #90 x 3   Entered and Authorized by:   Judith Part MD   Signed by:   Judith Part MD on 09/03/2009   Method used:   Print then Give to Patient   RxID:   [id number] GLUCOPHAGE 500 MG  TABS (METFORMIN HCL) 2 by mouth q am, 2 by mouth q pm  #3 months x 3  Entered and Authorized by:   Judith Part MD   Signed by:   Judith Part MD on 09/03/2009   Method used:   Print then Give to Patient   RxID:   [id number] HYZAAR 100-12.5 MG  TABS (LOSARTAN  POTASSIUM-HCTZ) one by mouth once daily  #90 x 3   Entered and Authorized by:   Judith Part MD   Signed by:   Judith Part MD on 09/03/2009   Method used:   Print then Give to Patient   RxID:   [id number] GABAPENTIN 300 MG CAPS (GABAPENTIN) take two capsules two  times a day  #3 months x 3   Entered and Authorized by:   Judith Part MD   Signed by:   Judith Part MD on 09/03/2009   Method used:   Print then Give to Patient   RxID:   [id number]   Current Allergies (reviewed today): LOPRESSOR ACE INHIBITORS * DETROL LIPITOR DARVOCET LODINE * CRESTOR

## 2010-08-20 NOTE — Miscellaneous (Signed)
Summary: Glipizide 10mg  XR update med list  Clinical Lists Changes  Medications: Added new medication of GLIPIZIDE 10 MG XR24H-TAB (GLIPIZIDE) take one tablet by mouth daily Removed medication of GLUCOPHAGE 500 MG  TABS (METFORMIN HCL) 2 by mouth q am, 2 by mouth q pm Observations: Added new observation of MEDS REVIEW: Done (09/25/2009 10:54)     Current Allergies: LOPRESSOR ACE INHIBITORS * DETROL LIPITOR DARVOCET LODINE * CRESTOR

## 2010-08-20 NOTE — Miscellaneous (Signed)
Summary: Orders Update  Clinical Lists Changes  Problems: Added new problem of EDEMA (ICD-782.3) Added new problem of SHORTNESS OF BREATH (ICD-786.05) Orders: Added new Referral order of Echocardiogram (Echo) - Signed

## 2010-08-20 NOTE — Assessment & Plan Note (Signed)
Summary: FLU SHOT/CLE  Nurse Visit   Allergies: 1)  ! Metformin Hcl 2)  ! * Glipizide 3)  Lopressor 4)  Ace Inhibitors 5)  * Detrol 6)  Lipitor 7)  Darvocet 8)  Lodine 9)  * Crestor  Orders Added: 1)  Admin 1st Vaccine [90471] 2)  Flu Vaccine 4yrs + [04540]  Flu Vaccine Consent Questions     Do you have a history of severe allergic reactions to this vaccine? no    Any prior history of allergic reactions to egg and/or gelatin? no    Do you have a sensitivity to the preservative Thimersol? no    Do you have a past history of Guillan-Barre Syndrome? no    Do you currently have an acute febrile illness? no    Have you ever had a severe reaction to latex? no    Vaccine information given and explained to patient? yes    Are you currently pregnant? no    Lot Number:[id number]   Exp Date:01/17/2011   Site Given  Left Deltoid IM1

## 2010-08-20 NOTE — Progress Notes (Signed)
Summary: U/A  Phone Note Call from Patient Call back at [facility] daughter [phone]   Caller: Patient Call For: Judith Part MD Summary of Call: Pt came in for u/a today. This is f/u from 09/03/09 lab results. Initial call taken by: Lewanda Rife LPN,  September 09, 2009 3:22 PM  New Problems: UTI (ICD-599.0)   New Problems: UTI (ICD-599.0)  Laboratory Results   Urine Tests  Date/Time Received: September 09, 2009 3:22 PM  Date/Time Reported: September 09, 2009 3:22 PM   Routine Urinalysis   Color: yellow Appearance: slightly hazy Glucose: negative   (Normal Range: Negative) Bilirubin: negative   (Normal Range: Negative) Ketone: negative   (Normal Range: Negative) Spec. Gravity: 1.010   (Normal Range: 1.003-1.035) Blood: negative   (Normal Range: Negative) pH: 6.0   (Normal Range: 5.0-8.0) Protein: trace   (Normal Range: Negative) Urobilinogen: 0.2   (Normal Range: 0-1) Nitrite: negative   (Normal Range: Negative) Leukocyte Esterace: small   (Normal Range: Negative)  Urine Microscopic WBC/HPF: 3-4 RBC/HPF: 2-3 Bacteria/HPF: mod Mucous/HPF: 2-3 Epithelial/HPF: 2-3 Crystals/HPF: 0 Casts/LPF: 0 Yeast/HPF: 0 Other: 0    Comments: There is also a nurse visit for this today also. urine looks possibly infected - will send for cx  update family I will inst further when this returns     Ian Malkin, pt's daughter notified as instructed and will wait to hear back after culture results.Lewanda Rife LPN  September 09, 2009 4:31 PM

## 2010-08-20 NOTE — Assessment & Plan Note (Signed)
Summary: f/u urine, labs/tsc   Vital Signs:  Patient profile:   77 year old female Weight:      188 pounds Temp:     97.6 degrees F oral Pulse rate:   80 / minute Pulse rhythm:   regular BP sitting:   140 / 60  (left arm) Cuff size:   large  Vitals Entered By: Mervin Hack CMA Duncan Dull) (September 24, 2009 2:02 PM) CC: follow-up visit   History of Present Illness: here for f/u of uti and elevated cr   tx with low dose cipro in retrospect was having some discomfort in her side  is better now   occas a little burning when she urinates - better now  no odor no fever or nausea   to re check today  Allergies: 1)  Lopressor 2)  Ace Inhibitors 3)  * Detrol 4)  Lipitor 5)  Darvocet 6)  Lodine 7)  * Crestor  Past History:  Past Medical History: Last updated: 06/06/2008 Diabetes mellitus, type II Hypertension Nephrolithiasis, hx of Rheumatoid arthritis Urinary incontinence salivary gland mass R umbilical hernia anemia  R submandibular neck mass  macular degeneration  heme--Dr [facility] E[facility]   Past Surgical History: Last updated: 06/06/2008 cataract sx  Family History: Last updated: 04/08/2007 Father: prostate cancer, suicide Mother: deceased age 41- MI Siblings: 1 brother, 1 sister  Social History: Last updated: 06/06/2008 Marital Status: Married Children: 5 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 Eyes:  Denies blurring and eye irritation. CV:  Denies chest pain or discomfort and palpitations. Resp:  Denies cough and shortness of breath. GI:  Denies abdominal pain, bloody stools, and change in bowel habits. GU:  Denies discharge, dysuria, hematuria, and urinary frequency. MS:  Complains of joint pain and stiffness; denies muscle aches. Derm:  Denies itching, lesion(s), poor wound healing, and  rash. Neuro:  Denies numbness and tingling. Endo:  Denies excessive thirst and excessive urination. Heme:  Denies abnormal bruising and bleeding.  Physical Exam  General:  overweight but generally well appearing  Head:  normocephalic, atraumatic, and no abnormalities observed.   Eyes:  vision grossly intact, pupils equal, pupils round, and pupils reactive to light.   Mouth:  MMM Neck:  neck mass on R is larger and slt tender no other acute changes no JVD or bruits  Lungs:  Normal respiratory effort, chest expands symmetrically. Lungs are clear to auscultation, no crackles or wheezes. Heart:  Normal rate and regular rhythm. S1 and S2 normal without gallop, murmur, click, rub or other extra sounds. Abdomen:  obese abd  baseline umbilical hernia  no suprapubic tenderness or fullness felt  Msk:  no CVA tenderness  Extremities:  No clubbing, cyanosis, edema, or deformity noted with normal full range of motion of all joints.   Skin:  Intact without suspicious lesions or rashes Cervical Nodes:  No lymphadenopathy noted Inguinal Nodes:  No significant adenopathy Psych:  normal affect, talkative and pleasant    Impression & Recommendations:  Problem # 1:  RENAL INSUFFICIENCY (ICD-588.9) Assessment Deteriorated with cr 1.4 (? baseline lately 1.2)  likely multifactorial- but recent incidental e coli uti (pt has DM and HTN )  tx with low dose cipro  ua clear today pend renal prof if still elevated may need to change some renally toxic meds ie: metformin/hyzaar  Orders: Venipuncture (16109)  TLB-Renal Function Panel (80069-RENAL)  Problem # 2:  UTI (ICD-599.0) Assessment: Improved tx with cipro with clear ua today  hope this will imp cr  per pt now asymptomatic  The following medications were removed from the medication list:    Cipro 250 Mg Tabs (Ciprofloxacin hcl) .Marland Kitchen... Take one tablet by mouth two times a day for seven days Her updated medication list for this problem  includes:    Detrol La 4 Mg Cp24 (Tolterodine tartrate) .Marland Kitchen... 1 by mouth once daily  Orders: Venipuncture (91478) TLB-Renal Function Panel (80069-RENAL)  Complete Medication List: 1)  Gabapentin 300 Mg Caps (Gabapentin) .... Take two capsules two  times a day 2)  Hyzaar 100-12.5 Mg Tabs (Losartan potassium-hctz) .... One by mouth once daily 3)  Glucophage 500 Mg Tabs (Metformin hcl) .... 2 by mouth q am, 2 by mouth q pm 4)  Zetia 10 Mg Tabs (Ezetimibe) .Marland Kitchen.. 1 by mouth once daily 5)  Detrol La 4 Mg Cp24 (Tolterodine tartrate) .Marland Kitchen.. 1 by mouth once daily 6)  Folic Acid 1 Mg Tabs (Folic acid) .Marland Kitchen.. 1 by mouth once daily 7)  Advil 200 Mg Caps (Ibuprofen) .... Once daily 8)  Contour Blood Glucose System Devi (Blood glucose monitoring suppl) .... To check glucose once daily and as needed 9)  Bayer Contour Test Strp (Glucose blood) .... Check blood sugar once daily and as needed dx: 250.52 10)  B Complex Vitamins Caps (B complex vitamins) .... Once daily  Current Allergies (reviewed today): LOPRESSOR ACE INHIBITORS * DETROL LIPITOR DARVOCET LODINE * CRESTOR  Laboratory Results   Urine Tests    Routine Urinalysis   Color: yellow Appearance: Clear Glucose: negative   (Normal Range: Negative) Bilirubin: negative   (Normal Range: Negative) Ketone: negative   (Normal Range: Negative) Spec. Gravity: 1.010   (Normal Range: 1.003-1.035) Blood: negative   (Normal Range: Negative) pH: 6.5   (Normal Range: 5.0-8.0) Protein: negative   (Normal Range: Negative) Urobilinogen: 0.2   (Normal Range: 0-1) Nitrite: negative   (Normal Range: Negative) Leukocyte Esterace: negative   (Normal Range: Negative)  Urine Microscopic WBC/hpf: 0 RBC/hpf: 0 Bacteria: 0 Mucous: few Epithelial: 1-3 Crystals/LPF: few Casts/LPF: 0 Yeast/HPF: 0 Other: 0

## 2010-08-21 NOTE — Letter (Signed)
Summary: [facility]   Imported By: Lanelle Bal 07/05/2010 10:01:39  _____________________________________________________________________  External Attachment:    Type:   Image     Comment:   External Document

## 2010-08-21 NOTE — Consult Note (Signed)
Summary: Dr.Gregory Greenwood,Nephrology,Note  Dr.Gregory Greenwood,Nephrology,Note   Imported By: Beau Fanny 06/30/2010 09:50:48  _____________________________________________________________________  External Attachment:    Type:   Image     Comment:   External Document

## 2011-02-10 ENCOUNTER — Encounter: Payer: Self-pay | Admitting: Cardiovascular Disease

## 2011-05-19 ENCOUNTER — Ambulatory Visit (INDEPENDENT_AMBULATORY_CARE_PROVIDER_SITE_OTHER): Payer: 59

## 2011-05-19 DIAGNOSIS — Z23 Encounter for immunization: Secondary | ICD-10-CM

## 2011-05-29 ENCOUNTER — Other Ambulatory Visit: Payer: Self-pay

## 2011-05-29 NOTE — Telephone Encounter (Signed)
[facility] faxed refill request for Hyzaar 100/12.5 and Metformin 500 mg. Forms request to be filled out and faxed back. Forms are on Dr Royden Purl shelf in the in box.

## 2011-06-02 MED ORDER — METFORMIN HCL 500 MG PO TABS: 1000.0000 mg | ORAL_TABLET | Freq: Two times a day (BID) | ORAL | Status: DC

## 2011-06-02 MED ORDER — LOSARTAN POTASSIUM-HCTZ 100-12.5 MG PO TABS: 1.0000 | ORAL_TABLET | Freq: Every day | ORAL | Status: DC

## 2011-06-02 NOTE — Telephone Encounter (Signed)
Forms done and in IN box

## 2011-06-05 NOTE — Telephone Encounter (Signed)
Refills sent

## 2011-06-12 ENCOUNTER — Encounter: Payer: Self-pay | Admitting: Family Medicine

## 2011-06-12 ENCOUNTER — Ambulatory Visit (INDEPENDENT_AMBULATORY_CARE_PROVIDER_SITE_OTHER): Payer: 59 | Admitting: Family Medicine

## 2011-06-12 VITALS — BP 110/68 | HR 81 | Temp 97.3°F | Ht 62.0 in | Wt 184.2 lb

## 2011-06-12 DIAGNOSIS — E7800 Pure hypercholesterolemia, unspecified: Secondary | ICD-10-CM

## 2011-06-12 DIAGNOSIS — N259 Disorder resulting from impaired renal tubular function, unspecified: Secondary | ICD-10-CM

## 2011-06-12 DIAGNOSIS — I10 Essential (primary) hypertension: Secondary | ICD-10-CM

## 2011-06-12 DIAGNOSIS — Z23 Encounter for immunization: Secondary | ICD-10-CM

## 2011-06-12 DIAGNOSIS — E119 Type 2 diabetes mellitus without complications: Secondary | ICD-10-CM

## 2011-06-12 LAB — RENAL FUNCTION PANEL
Albumin: 4 g/dL (ref 3.5–5.2)
Calcium: 9.4 mg/dL (ref 8.4–10.5)
Creatinine, Ser: 1.3 mg/dL — ABNORMAL HIGH (ref 0.4–1.2)
Glucose, Bld: 89 mg/dL (ref 70–99)
Phosphorus: 3.9 mg/dL (ref 2.3–4.6)

## 2011-06-12 LAB — CBC WITH DIFFERENTIAL/PLATELET
Basophils Relative: 0.8 % (ref 0.0–3.0)
Eosinophils Relative: 4 % (ref 0.0–5.0)
HCT: 33.7 % — ABNORMAL LOW (ref 36.0–46.0)
Lymphs Abs: 1.4 10*3/uL (ref 0.7–4.0)
MCV: 93.4 fl (ref 78.0–100.0)
Monocytes Relative: 8.5 % (ref 3.0–12.0)
Neutrophils Relative %: 60.8 % (ref 43.0–77.0)
Platelets: 139 10*3/uL — ABNORMAL LOW (ref 150.0–400.0)
RBC: 3.61 Mil/uL — ABNORMAL LOW (ref 3.87–5.11)
WBC: 5.4 10*3/uL (ref 4.5–10.5)

## 2011-06-12 LAB — LIPID PANEL
Cholesterol: 212 mg/dL — ABNORMAL HIGH (ref 0–200)
HDL: 52.3 mg/dL (ref >39.00)
Total CHOL/HDL Ratio: 4
VLDL: 28 mg/dL (ref 0.0–40.0)

## 2011-06-12 LAB — TSH: TSH: 0.66 u[IU]/mL (ref 0.35–5.50)

## 2011-06-12 LAB — LDL CHOLESTEROL, DIRECT: Direct LDL: 144.1 mg/dL

## 2011-06-12 MED ORDER — GABAPENTIN 300 MG PO CAPS: ORAL_CAPSULE | ORAL | Status: DC

## 2011-06-12 MED ORDER — EZETIMIBE 10 MG PO TABS: 10.0000 mg | ORAL_TABLET | Freq: Every day | ORAL | Status: DC

## 2011-06-12 MED ORDER — LOSARTAN POTASSIUM-HCTZ 100-12.5 MG PO TABS: 1.0000 | ORAL_TABLET | Freq: Every day | ORAL | Status: DC

## 2011-06-12 MED ORDER — METFORMIN HCL 500 MG PO TABS: ORAL_TABLET | ORAL | Status: DC

## 2011-06-12 MED ORDER — TOLTERODINE TARTRATE ER 4 MG PO CP24: 4.0000 mg | ORAL_CAPSULE | Freq: Every day | ORAL | Status: DC

## 2011-06-12 NOTE — Assessment & Plan Note (Signed)
bp in fair control at this time  No changes needed  Disc lifstyle change with low sodium diet and exercise   meds refilled

## 2011-06-12 NOTE — Patient Instructions (Signed)
Make sure to schedule your own eye doctor appt  Tdap today  If you are interested in a shingles/zoster vaccine - call your insurance to check on coverage,( you should not get it within 1 month of other vaccines) , then call us for a prescription  for it to take to a pharmacy that gives the shot   labs today  Do your best to watch out for fat and sugar in your diet

## 2011-06-12 NOTE — Progress Notes (Signed)
Subjective:    Patient ID: Kristina Bennett, female    DOB: 01/16/1931, 77 y.o.   MRN: [id number]  HPI Here for check up of numerous chronic health problems and also to rev health mt list  Has been feeling ok overall  No new medical problems   HTN - good control 110/68 No cp or ha or palpitations   Wt is down 8 lb with bmi of 33 Not eating as much as she used to - appetite less with age   DM - due for a1c  Lab Results  Component Value Date   HGBA1C 5.9 05/26/2010    On metformin Has been checking sugars once daily  ams good- pms sugars - usually 150s  Has neuropathy - bothers her in her hands  opthy -- last in 2011 April  Will set up her own appt with Dr Lelon Frohlich  Vision is fair -- mac deg   Lipids - due for check  On zetia - does not tolerate statins Diet-- does not really pay attn to  Lab Results  Component Value Date   CHOL 209* 05/26/2010   HDL 52.20 05/26/2010   LDLCALC 114* 09/03/2009   LDLDIRECT 138.5 05/26/2010   TRIG 142.0 05/26/2010   CHOLHDL 4 05/26/2010    Renal insuff due for labs Water intake- is very good  Sees Dr Charlies Silvers in [place] sees her -- stopped her advil (is hard to stay off it )  On hyzaar   Chemistry      Component Value Date/Time   NA 141 05/26/2010 1436   K 4.4 05/26/2010 1436   CL 104 05/26/2010 1436   CO2 26 05/26/2010 1436   BUN 29* 05/26/2010 1436   CREATININE 1.5* 05/26/2010 1436      Component Value Date/Time   CALCIUM 9.2 05/26/2010 1436   ALKPHOS 67 09/03/2009 1427   AST 20 05/26/2010 1436   ALT 11 05/26/2010 1436   BILITOT 0.8 09/03/2009 1427       Tdap-? Last one  Had flu shot-had oct 30th  Zoster status  PTX 2010  Colon screen - never had a colonosc  Does not want to do a stool card   Mammogram- stopped going - declines them  Self exam -no lumps  Does want breast exam sitting in the chair   Patient Active Problem List  Diagnoses  . DIABETES MELLITUS, TYPE II  . NEPHROPATHY, DIABETIC  . DIABETIC PERIPHERAL NEUROPATHY    . HYPERCHOLESTEROLEMIA  . UNSPECIFIED ANEMIA  . MACULAR DEGENERATION  . HYPERTENSION  . VARICOSE VEINS, LOWER EXTREMITIES  . RENAL INSUFFICIENCY  . RHEUMATOID ARTHRITIS  . ADHESIVE CAPSULITIS, BILATERAL  . EDEMA  . NECK MASS  . URINARY INCONTINENCE  . NEPHROLITHIASIS, HX OF   Past Medical History  Diagnosis Date  . DM2 (diabetes mellitus, type 2)   . HTN (hypertension)   . Nephrolithiasis     hx  . Rheumatoid arthritis   . Urinary incontinence   . Anemia   . Umbilical hernia   . Macular degeneration   . Renal insufficiency     poss renal vasc dz  . Kidney congenitally absent, left     small  . Diabetes mellitus   . Rheumatoid arthritis   . Anemia   . Macular degeneration   . Renal insufficiency    Past Surgical History  Procedure Date  . Cataract signs   . 411 echo     mild diastolic dysfunction EF 60%, slt pulm HTN  History  Substance Use Topics  . Smoking status: Never Smoker   . Smokeless tobacco: Not on file  . Alcohol Use: Not on file   Family History  Problem Relation Age of Onset  . Heart attack Mother   . Prostate cancer Father    Allergies  Allergen Reactions  . Ace Inhibitors     REACTION: ? reaction  . Atorvastatin     REACTION: ? reaction  . Etodolac     REACTION: swelling and dizziness  . Glipizide     REACTION: low sugar  . Metformin     REACTION: inc cr  . Metoprolol Tartrate     REACTION: ? reaction  . Propoxyphene N-Acetaminophen     REACTION: ? reaction  . Rosuvastatin     REACTION: ? reaction  . Tolterodine Tartrate     REACTION: ? reaction   No current outpatient prescriptions on file prior to visit.       Review of Systems Review of Systems  Constitutional: Negative for fever, appetite change, and unexpected weight change. pos for fatigue (for taking care of spouse) Eyes: Negative for pain and visual disturbance.  Respiratory: Negative for cough and shortness of breath.   Cardiovascular: Negative for cp or  palpitations    Gastrointestinal: Negative for nausea, diarrhea and constipation.  Genitourinary: Negative for urgency and frequency.  Skin: Negative for pallor or rash   MSK pos for joint pain from OA  Neurological: Negative for weakness, light-headedness, numbness and headaches.  Hematological: Negative for adenopathy. Does not bruise/bleed easily.  Psychiatric/Behavioral: Negative for dysphoric mood. The patient is anxious at times from stress        Objective:   Physical Exam  Constitutional: She appears well-developed and well-nourished. No distress.       overwt and well appearing   HENT:  Head: Normocephalic and atraumatic.  Right Ear: External ear normal.  Left Ear: External ear normal.  Nose: Nose normal.  Mouth/Throat: Oropharynx is clear and moist.  Eyes: Conjunctivae and EOM are normal. Pupils are equal, round, and reactive to light.  Neck: Normal range of motion. Neck supple. No JVD present. Carotid bruit is not present. No thyromegaly present.       Large neck mass on R is slt larger- pt does not want to address that further after extensive work up  Cardiovascular: Normal rate, regular rhythm, normal heart sounds and intact distal pulses.  Exam reveals no gallop.   Pulmonary/Chest: Effort normal and breath sounds normal. No respiratory distress. She has no wheezes. She exhibits no tenderness.  Abdominal: Soft. Bowel sounds are normal. She exhibits mass. She exhibits no distension. There is no tenderness.  Genitourinary: No breast swelling, tenderness, discharge or bleeding.       No breast masses upon exam sitting in chair   Musculoskeletal: She exhibits tenderness. She exhibits no edema.       Extensive OA- cannot get on to the table   Lymphadenopathy:    She has no cervical adenopathy.  Neurological: She is alert. She has normal reflexes. No cranial nerve deficit. She exhibits normal muscle tone. Coordination normal.  Skin: Skin is warm and dry. No rash noted. No  erythema. No pallor.  Psychiatric: She has a normal mood and affect.          Assessment & Plan:

## 2011-06-12 NOTE — Assessment & Plan Note (Signed)
Disc goals for lipids and reasons to control them Rev labs with pt Rev low sat fat diet in detail  On zetia- non tol of statins  Lab today

## 2011-06-12 NOTE — Assessment & Plan Note (Signed)
Followed by renal  Stressed imp of NO NSAIDS  Offered pain med if needed  Renal panel today  Also rev water intake

## 2011-06-12 NOTE — Assessment & Plan Note (Signed)
Labs today Last a1c excellent Some pm sugar high- disc low glycemic diet - pt not motivated Continue metformin Will make own eye exam On arb

## 2011-06-25 ENCOUNTER — Telehealth: Payer: Self-pay | Admitting: Internal Medicine

## 2011-06-25 NOTE — Telephone Encounter (Signed)
Patient called and would like to know her lab results.

## 2011-06-25 NOTE — Telephone Encounter (Signed)
Patient's daughter, Bonita Quin notified as instructed by telephone.

## 2011-11-17 ENCOUNTER — Telehealth: Payer: Self-pay | Admitting: Family Medicine

## 2011-11-17 DIAGNOSIS — E119 Type 2 diabetes mellitus without complications: Secondary | ICD-10-CM

## 2011-11-17 DIAGNOSIS — D649 Anemia, unspecified: Secondary | ICD-10-CM

## 2011-11-17 DIAGNOSIS — R609 Edema, unspecified: Secondary | ICD-10-CM

## 2011-11-17 DIAGNOSIS — I10 Essential (primary) hypertension: Secondary | ICD-10-CM

## 2011-11-17 DIAGNOSIS — E7800 Pure hypercholesterolemia, unspecified: Secondary | ICD-10-CM

## 2011-11-17 NOTE — Telephone Encounter (Signed)
Message copied by Judy Pimple on Tue Nov 17, 2011  9:53 PM ------      Message from: Alvina Chou      Created: Thu Nov 12, 2011  3:39 PM      Regarding: labs for Nov 18 2011       F/u labs

## 2011-11-18 ENCOUNTER — Other Ambulatory Visit (INDEPENDENT_AMBULATORY_CARE_PROVIDER_SITE_OTHER): Payer: 59

## 2011-11-18 DIAGNOSIS — I10 Essential (primary) hypertension: Secondary | ICD-10-CM

## 2011-11-18 DIAGNOSIS — D649 Anemia, unspecified: Secondary | ICD-10-CM

## 2011-11-18 DIAGNOSIS — E119 Type 2 diabetes mellitus without complications: Secondary | ICD-10-CM

## 2011-11-18 DIAGNOSIS — R609 Edema, unspecified: Secondary | ICD-10-CM

## 2011-11-18 DIAGNOSIS — E7800 Pure hypercholesterolemia, unspecified: Secondary | ICD-10-CM

## 2011-11-18 LAB — CBC WITH DIFFERENTIAL/PLATELET
Basophils Absolute: 0 10*3/uL (ref 0.0–0.1)
Basophils Relative: 0.5 % (ref 0.0–3.0)
Eosinophils Absolute: 0.2 10*3/uL (ref 0.0–0.7)
Eosinophils Relative: 3.8 % (ref 0.0–5.0)
HCT: 35.1 % — ABNORMAL LOW (ref 36.0–46.0)
Hemoglobin: 11.9 g/dL — ABNORMAL LOW (ref 12.0–15.0)
Lymphs Abs: 1.8 10*3/uL (ref 0.7–4.0)
Monocytes Absolute: 0.5 10*3/uL (ref 0.1–1.0)
Monocytes Relative: 9.6 % (ref 3.0–12.0)
Platelets: 135 10*3/uL — ABNORMAL LOW (ref 150.0–400.0)
RBC: 3.81 Mil/uL — ABNORMAL LOW (ref 3.87–5.11)
WBC: 5.6 10*3/uL (ref 4.5–10.5)

## 2011-11-18 LAB — LIPID PANEL
Cholesterol: 212 mg/dL — ABNORMAL HIGH (ref 0–200)
Total CHOL/HDL Ratio: 4
Triglycerides: 126 mg/dL (ref 0.0–149.0)
VLDL: 25.2 mg/dL (ref 0.0–40.0)

## 2011-11-18 LAB — COMPREHENSIVE METABOLIC PANEL
ALT: 9 U/L (ref 0–35)
AST: 16 U/L (ref 0–37)
Alkaline Phosphatase: 63 U/L (ref 39–117)
Calcium: 9.3 mg/dL (ref 8.4–10.5)
Chloride: 104 mEq/L (ref 96–112)
Creatinine, Ser: 1.3 mg/dL — ABNORMAL HIGH (ref 0.4–1.2)
Potassium: 4.1 mEq/L (ref 3.5–5.1)

## 2011-11-18 LAB — TSH: TSH: 0.31 u[IU]/mL — ABNORMAL LOW (ref 0.35–5.50)

## 2011-11-25 ENCOUNTER — Encounter: Payer: Self-pay | Admitting: Family Medicine

## 2011-11-25 ENCOUNTER — Ambulatory Visit (INDEPENDENT_AMBULATORY_CARE_PROVIDER_SITE_OTHER): Payer: 59 | Admitting: Family Medicine

## 2011-11-25 ENCOUNTER — Ambulatory Visit (INDEPENDENT_AMBULATORY_CARE_PROVIDER_SITE_OTHER)
Admission: RE | Admit: 2011-11-25 | Discharge: 2011-11-25 | Disposition: A | Discharge status: Home / Self Care | Payer: 59 | Source: Ambulatory Visit | Attending: Family Medicine | Admitting: Family Medicine

## 2011-11-25 VITALS — BP 140/62 | HR 94 | Temp 97.8°F | Ht 62.0 in | Wt 184.8 lb

## 2011-11-25 DIAGNOSIS — I10 Essential (primary) hypertension: Secondary | ICD-10-CM

## 2011-11-25 DIAGNOSIS — M79609 Pain in unspecified limb: Secondary | ICD-10-CM

## 2011-11-25 DIAGNOSIS — E7800 Pure hypercholesterolemia, unspecified: Secondary | ICD-10-CM

## 2011-11-25 DIAGNOSIS — M79672 Pain in left foot: Secondary | ICD-10-CM

## 2011-11-25 DIAGNOSIS — N259 Disorder resulting from impaired renal tubular function, unspecified: Secondary | ICD-10-CM

## 2011-11-25 DIAGNOSIS — D649 Anemia, unspecified: Secondary | ICD-10-CM

## 2011-11-25 DIAGNOSIS — F438 Other reactions to severe stress: Secondary | ICD-10-CM

## 2011-11-25 DIAGNOSIS — F430 Acute stress reaction: Secondary | ICD-10-CM | POA: Insufficient documentation

## 2011-11-25 DIAGNOSIS — E119 Type 2 diabetes mellitus without complications: Secondary | ICD-10-CM

## 2011-11-25 NOTE — Patient Instructions (Addendum)
If you change mind about shingles vaccine , let me know Labs are stable  Foot xray today Get as much help at home as you can and let me know if anxiety or depression worsens  Follow up in 6 months with labs prior

## 2011-11-25 NOTE — Progress Notes (Signed)
Subjective:    Patient ID: Kristina Bennett, female    DOB: [birth year], 78 y.o.   MRN: [id number]  HPI Here for f/u of chronic conditions  Is having trouble with L foot - stepping down on it  Was bruised - started in march , and swollen- that is improved  Is a little better  painis in the arch of the foot  Wears different shoes - the current ones are most comfortable  Is much worse first thing in am  Not taking any med for it except tylenol   Having a lot of stress caring for her husband  Is making her agitated/ irritated  Wanted a "nerve pill" Does not have time of money for counseling Fairly supportive family who helps out quite a bit   bp is   140/62  Today BP Readings from Last 3 Encounters:  11/25/11 140/62  06/12/11 110/68  05/26/10 126/68    No cp or palpitations or headaches or edema  No side effects to medicines    Zoster status - not interested at this time - may change her mind   Renal insuff   Chemistry      Component Value Date/Time   NA 140 11/18/2011 0858   K 4.1 11/18/2011 0858   CL 104 11/18/2011 0858   CO2 27 11/18/2011 0858   BUN 21 11/18/2011 0858   CREATININE 1.3* 11/18/2011 0858      Component Value Date/Time   CALCIUM 9.3 11/18/2011 0858   ALKPHOS 63 11/18/2011 0858   AST 16 11/18/2011 0858   ALT 9 11/18/2011 0858   BILITOT 1.2 11/18/2011 0858     This is stable- no new reports  She did have a diabetic ulcer - had to be treated on her toe- better now   Wt is stable/ obese- not motivated to loose   Diabetes Home sugar results  DM diet - not motivated to change  Exercise - takes care of her husband - she declines further exercise Not much time to care for herself  Symptoms none A1C last is 5.9- quite stable  No problems with medications metformin Renal protection on arb Last eye exam -?  Hx of anemia -  > rel to chronic dz Stable Lab Results  Component Value Date   WBC 5.6 11/18/2011   HGB 11.9* 11/18/2011   HCT 35.1* 11/18/2011   MCV 92.0 11/18/2011     PLT 135.0* 11/18/2011     Lipids also stable  Lab Results  Component Value Date   CHOL 212* 11/18/2011   HDL 55.90 11/18/2011   LDLCALC 114* 09/03/2009   LDLDIRECT 141.0 11/18/2011   TRIG 126.0 11/18/2011   CHOLHDL 4 11/18/2011   diet - fair-intol of stating  Overall fairly stable   Patient Active Problem List  Diagnoses  . DIABETES MELLITUS, TYPE II  . NEPHROPATHY, DIABETIC  . DIABETIC PERIPHERAL NEUROPATHY  . HYPERCHOLESTEROLEMIA  . UNSPECIFIED ANEMIA  . MACULAR DEGENERATION  . HYPERTENSION  . VARICOSE VEINS, LOWER EXTREMITIES  . RENAL INSUFFICIENCY  . RHEUMATOID ARTHRITIS  . ADHESIVE CAPSULITIS, BILATERAL  . EDEMA  . NECK MASS  . URINARY INCONTINENCE  . NEPHROLITHIASIS, HX OF  . Foot pain, left  . Stress reaction   Past Medical History  Diagnosis Date  . DM2 (diabetes mellitus, type 2)   . HTN (hypertension)   . Nephrolithiasis     hx  . Rheumatoid arthritis   . Urinary incontinence   . Anemia   .  Umbilical hernia   . Macular degeneration   . Renal insufficiency     poss renal vasc dz  . Kidney congenitally absent, left     small  . Diabetes mellitus   . Rheumatoid arthritis   . Anemia   . Macular degeneration   . Renal insufficiency    Past Surgical History  Procedure Date  . Cataract signs   . 411 echo     mild diastolic dysfunction EF 60%, slt pulm HTN    History  Substance Use Topics  . Smoking status: Never Smoker   . Smokeless tobacco: Not on file  . Alcohol Use: Not on file   Family History  Problem Relation Age of Onset  . Heart attack Mother   . Prostate cancer Father    Allergies  Allergen Reactions  . Ace Inhibitors     REACTION: ? reaction  . Atorvastatin     REACTION: ? reaction  . Etodolac     REACTION: swelling and dizziness  . Glipizide     REACTION: low sugar  . Metformin     REACTION: inc cr  . Metoprolol Tartrate     REACTION: ? reaction  . Propoxyphene-Acetaminophen     REACTION: ? reaction  . Rosuvastatin      REACTION: ? reaction  . Tolterodine Tartrate     REACTION: ? reaction   Current Outpatient Prescriptions on File Prior to Visit  Medication Sig Dispense Refill  . Blood Glucose Monitoring Suppl (CONTOUR BLOOD GLUCOSE SYSTEM) W/DEVICE KIT Use to check blood sugar once daily       . Cholecalciferol (VITAMIN D) 2000 UNITS CAPS Take 1 capsule by mouth daily.        Marland Kitchen ezetimibe (ZETIA) 10 MG tablet Take 1 tablet (10 mg total) by mouth daily.  90 tablet  3  . folic acid (FOLVITE) 1 MG tablet Take 1 mg by mouth daily.        Marland Kitchen gabapentin (NEURONTIN) 300 MG capsule Take two capsules two times daily  360 capsule  3  . glucose blood (BAYER CONTOUR TEST) test strip 1 each by Other route daily. Use as instructed       . losartan-hydrochlorothiazide (HYZAAR) 100-12.5 MG per tablet Take 1 tablet by mouth daily.  90 tablet  3  . metFORMIN (GLUCOPHAGE) 500 MG tablet Take 2 tablets by mouth twice daily  360 tablet  3  . tolterodine (DETROL LA) 4 MG 24 hr capsule Take 1 capsule (4 mg total) by mouth daily.  90 capsule  3        Review of Systems Review of Systems  Constitutional: Negative for fever, appetite change, and unexpected weight change.  Eyes: Negative for pain and visual disturbance.  Respiratory: Negative for cough and shortness of breath.   Cardiovascular: Negative for cp or palpitations    Gastrointestinal: Negative for nausea, diarrhea and constipation.  Genitourinary: Negative for urgency and frequency.  Skin: Negative for pallor or rash   MSK pos for foot pain without swelling  Neurological: Negative for weakness, light-headedness and headaches. pos for numbness from DM neuropathy Hematological: Negative for adenopathy. Does not bruise/bleed easily.  Psychiatric/Behavioral: Negative for dysphoric mood. Pos for anxiety from severe situational stress         Objective:   Physical Exam  Constitutional: She appears well-developed and well-nourished. No distress.       Obese and well  appearing   HENT:  Head: Normocephalic and atraumatic.  Mouth/Throat: Oropharynx  is clear and moist.  Eyes: Conjunctivae and EOM are normal. Pupils are equal, round, and reactive to light. Right eye exhibits no discharge. Left eye exhibits no discharge.  Neck: Normal range of motion. Neck supple. No JVD present. No thyromegaly present.       Large mass R submandibular area that has grown  Cardiovascular: Normal rate, regular rhythm and normal heart sounds.  Exam reveals no gallop.   Pulmonary/Chest: Effort normal and breath sounds normal. No respiratory distress. She has no wheezes.  Abdominal: Soft. Bowel sounds are normal. She exhibits no distension, no abdominal bruit and no mass. There is no tenderness.  Musculoskeletal: She exhibits tenderness. She exhibits no edema.       L foot - tender in arch of foot and heel (plantar) , no bony tenderness dorsally Pain to dorsiflex foot  No swelling or acute joint changes No redness or skin changes   Lymphadenopathy:    She has no cervical adenopathy.  Neurological: She is alert. She has normal reflexes. No cranial nerve deficit. She exhibits normal muscle tone. Coordination normal.  Skin: Skin is warm and dry. No rash noted. No erythema. No pallor.  Psychiatric: She has a normal mood and affect.          Assessment & Plan:

## 2011-11-26 NOTE — Assessment & Plan Note (Signed)
Overall stable in medically complex pt  Mild anemia Will continue to follow

## 2011-11-26 NOTE — Assessment & Plan Note (Signed)
Stable on zetia  Non tol of statins Disc goals for lipids and reasons to control them Rev labs with pt Rev low sat fat diet in detail

## 2011-11-26 NOTE — Assessment & Plan Note (Signed)
bp in fair control at this time  No changes needed  Disc lifstyle change with low sodium diet and exercise  Labs reviewed  

## 2011-11-26 NOTE — Assessment & Plan Note (Signed)
Long discussion about this - disc risk of falls and MS change on anx meds She declines counseling It is clear she needs more breaks and help caring for her husband - and that they need to look for placement soon She is in agreement Rev stressors/ coping skills/ tx opt/ poss side eff in detail Will work with family on this  She certainly needs time to care for herself >25 min spent with face to face with patient, >50% counseling and/or coordinating care

## 2011-11-26 NOTE — Assessment & Plan Note (Signed)
This is stable and well controlled with neuropathy  Rev low glycemic diet Little time or motivation to care for herself

## 2011-11-26 NOTE — Assessment & Plan Note (Signed)
This is new Exam consistent with plantar fasciitis (in setting of DM neuropathy) Xray today to r/o stress fx Stressed imp of supportive shoes

## 2011-11-26 NOTE — Assessment & Plan Note (Signed)
Likely from chronic dz and stable

## 2012-05-17 ENCOUNTER — Ambulatory Visit: Payer: 59

## 2012-05-17 ENCOUNTER — Ambulatory Visit (INDEPENDENT_AMBULATORY_CARE_PROVIDER_SITE_OTHER): Payer: 59

## 2012-05-17 DIAGNOSIS — Z23 Encounter for immunization: Secondary | ICD-10-CM

## 2012-06-19 ENCOUNTER — Telehealth: Payer: Self-pay | Admitting: Family Medicine

## 2012-06-19 DIAGNOSIS — I10 Essential (primary) hypertension: Secondary | ICD-10-CM

## 2012-06-19 DIAGNOSIS — D649 Anemia, unspecified: Secondary | ICD-10-CM

## 2012-06-19 DIAGNOSIS — E7800 Pure hypercholesterolemia, unspecified: Secondary | ICD-10-CM

## 2012-06-19 DIAGNOSIS — N259 Disorder resulting from impaired renal tubular function, unspecified: Secondary | ICD-10-CM

## 2012-06-19 DIAGNOSIS — E119 Type 2 diabetes mellitus without complications: Secondary | ICD-10-CM

## 2012-06-19 NOTE — Telephone Encounter (Signed)
Message copied by Judy Pimple on Sun Jun 19, 2012 10:28 AM ------      Message from: Baldomero Lamy      Created: Thu Jun 09, 2012 10:55 AM      Regarding: Cpx labs 12/3 Tues       Please order  future cpx labs for pt's upcomming lab appt.      Thanks      Rodney Booze

## 2012-06-21 ENCOUNTER — Other Ambulatory Visit (INDEPENDENT_AMBULATORY_CARE_PROVIDER_SITE_OTHER): Payer: 59

## 2012-06-21 DIAGNOSIS — E7800 Pure hypercholesterolemia, unspecified: Secondary | ICD-10-CM

## 2012-06-21 DIAGNOSIS — N259 Disorder resulting from impaired renal tubular function, unspecified: Secondary | ICD-10-CM

## 2012-06-21 DIAGNOSIS — E119 Type 2 diabetes mellitus without complications: Secondary | ICD-10-CM

## 2012-06-21 DIAGNOSIS — I10 Essential (primary) hypertension: Secondary | ICD-10-CM

## 2012-06-21 DIAGNOSIS — D649 Anemia, unspecified: Secondary | ICD-10-CM

## 2012-06-21 LAB — CBC WITH DIFFERENTIAL/PLATELET
Basophils Relative: 0.3 % (ref 0.0–3.0)
Eosinophils Relative: 2.8 % (ref 0.0–5.0)
HCT: 36.6 % (ref 36.0–46.0)
Hemoglobin: 12.3 g/dL (ref 12.0–15.0)
Lymphs Abs: 1.4 10*3/uL (ref 0.7–4.0)
MCV: 91.9 fl (ref 78.0–100.0)
Monocytes Absolute: 0.5 10*3/uL (ref 0.1–1.0)
Monocytes Relative: 9.4 % (ref 3.0–12.0)
Neutro Abs: 3.1 10*3/uL (ref 1.4–7.7)
WBC: 5.3 10*3/uL (ref 4.5–10.5)

## 2012-06-21 LAB — COMPREHENSIVE METABOLIC PANEL
Alkaline Phosphatase: 64 U/L (ref 39–117)
BUN: 21 mg/dL (ref 6–23)
CO2: 27 mEq/L (ref 19–32)
Creatinine, Ser: 1.2 mg/dL (ref 0.4–1.2)
GFR: 45.53 mL/min — ABNORMAL LOW (ref >60.00)
Glucose, Bld: 137 mg/dL — ABNORMAL HIGH (ref 70–99)
Total Bilirubin: 1.3 mg/dL — ABNORMAL HIGH (ref 0.3–1.2)
Total Protein: 7.7 g/dL (ref 6.0–8.3)

## 2012-06-21 LAB — LIPID PANEL
Cholesterol: 231 mg/dL — ABNORMAL HIGH (ref 0–200)
HDL: 49.2 mg/dL (ref >39.00)
Triglycerides: 115 mg/dL (ref 0.0–149.0)

## 2012-06-21 LAB — HEMOGLOBIN A1C: Hgb A1c MFr Bld: 6 % (ref 4.6–6.5)

## 2012-06-21 LAB — LDL CHOLESTEROL, DIRECT: Direct LDL: 166.5 mg/dL

## 2012-06-27 ENCOUNTER — Encounter: Payer: Self-pay | Admitting: Family Medicine

## 2012-06-27 ENCOUNTER — Ambulatory Visit (INDEPENDENT_AMBULATORY_CARE_PROVIDER_SITE_OTHER): Payer: 59 | Admitting: Family Medicine

## 2012-06-27 VITALS — BP 134/76 | HR 72 | Temp 97.9°F | Ht 61.5 in | Wt 179.5 lb

## 2012-06-27 DIAGNOSIS — I10 Essential (primary) hypertension: Secondary | ICD-10-CM

## 2012-06-27 DIAGNOSIS — N259 Disorder resulting from impaired renal tubular function, unspecified: Secondary | ICD-10-CM

## 2012-06-27 DIAGNOSIS — Z1331 Encounter for screening for depression: Secondary | ICD-10-CM

## 2012-06-27 DIAGNOSIS — E7800 Pure hypercholesterolemia, unspecified: Secondary | ICD-10-CM

## 2012-06-27 DIAGNOSIS — E119 Type 2 diabetes mellitus without complications: Secondary | ICD-10-CM

## 2012-06-27 MED ORDER — EZETIMIBE 10 MG PO TABS: 10.0000 mg | ORAL_TABLET | Freq: Every day | ORAL | Status: DC

## 2012-06-27 MED ORDER — LOSARTAN POTASSIUM-HCTZ 100-12.5 MG PO TABS: 1.0000 | ORAL_TABLET | Freq: Every day | ORAL | Status: DC

## 2012-06-27 MED ORDER — TOLTERODINE TARTRATE ER 4 MG PO CP24: 4.0000 mg | ORAL_CAPSULE | Freq: Every day | ORAL | Status: DC

## 2012-06-27 MED ORDER — GABAPENTIN 300 MG PO CAPS: ORAL_CAPSULE | ORAL | Status: DC

## 2012-06-27 MED ORDER — METFORMIN HCL 500 MG PO TABS: ORAL_TABLET | ORAL | Status: DC

## 2012-06-27 NOTE — Progress Notes (Signed)
Subjective:    Patient ID: Kristina Bennett, female    DOB: 1931-02-08, 78 y.o.   MRN: [id number]  HPI Here for check up of chronic medical conditions and to review health mt list   Has been doing ok  Nothing new going on   Her husband is in a nursing home now  Finally she is able to get some rest and take care of herself a bit better  He has alzheimers  She is sad but also very very relieved and no longer feels trapped in her house   Wt is down 5 lb with bmi of 33 Is eating a better diet  Is able to get exercise more too - household things   Colon cancer screen- not interested in it   Declines zostavax- but may reconsider if insurance pays for it   Falls- none - thinks her balance is fair   Mood - is much brighter No depression  A bit more motivated  Overall much lighter mood   Diabetes Home sugar results -does not check DM diet - getting better Exercise -able to get some now Symptoms A1C last  Lab Results  Component Value Date   HGBA1C 6.0 06/21/2012    No problems with medications  Renal protection- on arb opthy 2011 -she will make her own f/u appt due for yearly   bp is stable today  No cp or palpitations or headaches or edema  No side effects to medicines  BP Readings from Last 3 Encounters:  06/27/12 134/76  11/25/11 140/62  06/12/11 110/68      Renal insuff   Chemistry      Component Value Date/Time   NA 138 06/21/2012 0901   K 4.0 06/21/2012 0901   CL 101 06/21/2012 0901   CO2 27 06/21/2012 0901   BUN 21 06/21/2012 0901   CREATININE 1.2 06/21/2012 0901      Component Value Date/Time   CALCIUM 9.5 06/21/2012 0901   ALKPHOS 64 06/21/2012 0901   AST 17 06/21/2012 0901   ALT 8 06/21/2012 0901   BILITOT 1.3* 06/21/2012 0901     overall stable   Hyperlipidemia  Lab Results  Component Value Date   CHOL 231* 06/21/2012   CHOL 212* 11/18/2011   CHOL 212* 06/12/2011   Lab Results  Component Value Date   HDL 49.20 06/21/2012   HDL 40.98 11/18/2011   HDL  11.91 06/12/2011   Lab Results  Component Value Date   LDLCALC 114* 09/03/2009   LDLCALC 128* 09/07/2008   LDLCALC 128* 06/06/2008   Lab Results  Component Value Date   TRIG 115.0 06/21/2012   TRIG 126.0 11/18/2011   TRIG 140.0 06/12/2011   Lab Results  Component Value Date   CHOLHDL 5 06/21/2012   CHOLHDL 4 11/18/2011   CHOLHDL 4 06/12/2011   Lab Results  Component Value Date   LDLDIRECT 166.5 06/21/2012   LDLDIRECT 141.0 11/18/2011   LDLDIRECT 144.1 06/12/2011   diet is fair -- overall may not be as low fat/ but she does watch it  Is on zetia - cannot tolerate statins   Patient Active Problem List  Diagnosis  . DIABETES MELLITUS, TYPE II  . NEPHROPATHY, DIABETIC  . DIABETIC PERIPHERAL NEUROPATHY  . HYPERCHOLESTEROLEMIA  . UNSPECIFIED ANEMIA  . MACULAR DEGENERATION  . HYPERTENSION  . VARICOSE VEINS, LOWER EXTREMITIES  . RENAL INSUFFICIENCY  . RHEUMATOID ARTHRITIS  . ADHESIVE CAPSULITIS, BILATERAL  . EDEMA  . NECK MASS  . URINARY  INCONTINENCE  . NEPHROLITHIASIS, HX OF  . Foot pain, left  . Stress reaction   Past Medical History  Diagnosis Date  . DM2 (diabetes mellitus, type 2)   . HTN (hypertension)   . Nephrolithiasis     hx  . Rheumatoid arthritis   . Urinary incontinence   . Anemia   . Umbilical hernia   . Macular degeneration   . Renal insufficiency     poss renal vasc dz  . Kidney congenitally absent, left     small  . Diabetes mellitus   . Rheumatoid arthritis   . Anemia   . Macular degeneration   . Renal insufficiency    Past Surgical History  Procedure Date  . Cataract signs   . 411 echo     mild diastolic dysfunction EF 60%, slt pulm HTN    History  Substance Use Topics  . Smoking status: Never Smoker   . Smokeless tobacco: Not on file  . Alcohol Use: No   Family History  Problem Relation Age of Onset  . Heart attack Mother   . Prostate cancer Father    Allergies  Allergen Reactions  . Ace Inhibitors     REACTION: ? reaction  .  Atorvastatin     REACTION: ? reaction  . Etodolac     REACTION: swelling and dizziness  . Glipizide     REACTION: low sugar  . Metformin     REACTION: inc cr  . Metoprolol Tartrate     REACTION: ? reaction  . Propoxyphene-Acetaminophen     REACTION: ? reaction  . Rosuvastatin     REACTION: ? reaction  . Tolterodine Tartrate     REACTION: ? reaction   Current Outpatient Prescriptions on File Prior to Visit  Medication Sig Dispense Refill  . Blood Glucose Monitoring Suppl (CONTOUR BLOOD GLUCOSE SYSTEM) W/DEVICE KIT Use to check blood sugar once daily       . Cholecalciferol (VITAMIN D) 2000 UNITS CAPS Take 1 capsule by mouth daily.        Marland Kitchen ezetimibe (ZETIA) 10 MG tablet Take 1 tablet (10 mg total) by mouth daily.  90 tablet  3  . folic acid (FOLVITE) 1 MG tablet Take 1 mg by mouth daily.        Marland Kitchen gabapentin (NEURONTIN) 300 MG capsule Take two capsules two times daily  360 capsule  3  . glucose blood (BAYER CONTOUR TEST) test strip 1 each by Other route daily. Use as instructed       . losartan-hydrochlorothiazide (HYZAAR) 100-12.5 MG per tablet Take 1 tablet by mouth daily.  90 tablet  3  . metFORMIN (GLUCOPHAGE) 500 MG tablet Take 2 tablets by mouth twice daily  360 tablet  3  . tolterodine (DETROL LA) 4 MG 24 hr capsule Take 1 capsule (4 mg total) by mouth daily.  90 capsule  3     Last eye exam 2011-will schedule      Review of Systems Review of Systems  Constitutional: Negative for fever, appetite change, fatigue and unexpected weight change.  Eyes: Negative for pain and visual disturbance.  ENT pos for growing mass in R neck- she is tolerating it , neg for swallowing problems  Respiratory: Negative for cough and shortness of breath.   Cardiovascular: Negative for cp or palpitations    Gastrointestinal: Negative for nausea, diarrhea and constipation.  Genitourinary: Negative for urgency and frequency.  Skin: Negative for pallor or rash   Neurological: Negative  for  weakness, light-headedness, numbness and headaches.  Hematological: Negative for adenopathy. Does not bruise/bleed easily.  Psychiatric/Behavioral: Negative for dysphoric mood. The patient is not nervous/anxious.         Objective:   Physical Exam  Constitutional: She appears well-developed and well-nourished. No distress.       obese and well appearing   HENT:  Head: Normocephalic and atraumatic.  Eyes: Conjunctivae normal and EOM are normal. Pupils are equal, round, and reactive to light. Right eye exhibits no discharge. Left eye exhibits no discharge. No scleral icterus.  Neck: Normal range of motion. Neck supple. No JVD present. Carotid bruit is not present. No thyromegaly present.       L sided submandibular neck mass continues to grow slowly Is firm and nt   Cardiovascular: Normal rate, regular rhythm and intact distal pulses.  Exam reveals no gallop.   Murmur heard. Pulmonary/Chest: Effort normal and breath sounds normal. No respiratory distress. She has no wheezes.  Abdominal: Soft. Bowel sounds are normal. She exhibits no distension and no mass. There is no tenderness.       Baseline umbilical hernia is unchanged Reducible and non tender   Genitourinary:       Declines breast exam/ or any screening   Musculoskeletal: She exhibits no edema and no tenderness.  Lymphadenopathy:    She has no cervical adenopathy.  Neurological: She is alert. She has normal reflexes. No cranial nerve deficit. She exhibits normal muscle tone. Coordination normal.  Skin: Skin is warm and dry. No rash noted. No erythema. No pallor.  Psychiatric: She has a normal mood and affect.       Smiling Good mood today          Assessment & Plan:

## 2012-06-27 NOTE — Patient Instructions (Addendum)
Do not forget to schedule your annual eye exam  If you are interested in a shingles/zoster vaccine - call your insurance to check on coverage,( you should not get it within 1 month of other vaccines) , then call us for a prescription  for it to take to a pharmacy that gives the shot , or make a nurse visit to get it here depending on your coverage Try to watch diet - keep working on weight loss  Stay as active as you can be  I'm really glad you are getting some sleep

## 2012-06-27 NOTE — Assessment & Plan Note (Signed)
Good control Tolerating metformin  Will sched own opthy exam On ARB Disc low glycemic diet and need for wt loss Finally has some time for self care

## 2012-06-27 NOTE — Assessment & Plan Note (Signed)
Lab Results  Component Value Date   CREATININE 1.2 06/21/2012   this is stable Reminded to get a good fluid intake  Will continue to follow F/u 6 mo

## 2012-06-27 NOTE — Assessment & Plan Note (Signed)
On zetia- this is all she can tolerate LDL is up  Disc goals for lipids and reasons to control them Rev labs with pt Rev low sat fat diet in detail

## 2012-06-27 NOTE — Assessment & Plan Note (Signed)
bp in fair control at this time  No changes needed  Disc lifstyle change with low sodium diet and exercise    Labs reviewed  F/u in 6 mo

## 2012-07-21 ENCOUNTER — Other Ambulatory Visit: Payer: Self-pay

## 2012-07-21 MED ORDER — TOLTERODINE TARTRATE ER 4 MG PO CP24: 4.0000 mg | ORAL_CAPSULE | Freq: Every day | ORAL | Status: DC

## 2012-07-21 MED ORDER — EZETIMIBE 10 MG PO TABS: 10.0000 mg | ORAL_TABLET | Freq: Every day | ORAL | Status: DC

## 2012-07-21 MED ORDER — GABAPENTIN 300 MG PO CAPS: ORAL_CAPSULE | ORAL | Status: DC

## 2012-07-21 NOTE — Telephone Encounter (Signed)
pts daughter request [facility] for zetia,detrol and gabapentin.[facility]; called [facility] spoke with Marian Sorrow and cancelled previous rx refills. Ms Benancio Deeds notified done.

## 2012-07-21 NOTE — Telephone Encounter (Signed)
pts daughter said 3  refills sent to [facility]. pts daughter to [facility] for them to get transfer of med from [place].

## 2012-08-01 ENCOUNTER — Telehealth: Payer: Self-pay | Admitting: Family Medicine

## 2012-08-01 MED ORDER — TOLTERODINE TARTRATE ER 4 MG PO CP24: 4.0000 mg | ORAL_CAPSULE | Freq: Every day | ORAL | Status: DC

## 2012-08-01 NOTE — Telephone Encounter (Signed)
Go ahead and please call that in -thanks

## 2012-08-01 NOTE — Telephone Encounter (Signed)
Rx called in as prescribed (to [facility]) and pt's daughter notified

## 2012-08-01 NOTE — Telephone Encounter (Signed)
Pt's daughter left vm stating that pt's medications from [facility] will not be shipped until 08/03/12 and pt needs refill on Detrol LA 4mg  to get her through until then (approx 10 caps according to daughter).  Daughter requests it be filled at [facility].

## 2012-12-26 ENCOUNTER — Ambulatory Visit: Payer: 59 | Admitting: Family Medicine

## 2013-03-29 ENCOUNTER — Ambulatory Visit (INDEPENDENT_AMBULATORY_CARE_PROVIDER_SITE_OTHER): Payer: Medicare Other | Admitting: Family Medicine

## 2013-03-29 ENCOUNTER — Encounter: Payer: Self-pay | Admitting: Family Medicine

## 2013-03-29 VITALS — BP 140/72 | HR 96 | Temp 98.6°F | Ht 61.5 in | Wt 175.8 lb

## 2013-03-29 DIAGNOSIS — E119 Type 2 diabetes mellitus without complications: Secondary | ICD-10-CM

## 2013-03-29 DIAGNOSIS — E11621 Type 2 diabetes mellitus with foot ulcer: Secondary | ICD-10-CM | POA: Insufficient documentation

## 2013-03-29 DIAGNOSIS — E1169 Type 2 diabetes mellitus with other specified complication: Secondary | ICD-10-CM

## 2013-03-29 DIAGNOSIS — Z23 Encounter for immunization: Secondary | ICD-10-CM

## 2013-03-29 DIAGNOSIS — I10 Essential (primary) hypertension: Secondary | ICD-10-CM

## 2013-03-29 DIAGNOSIS — L97509 Non-pressure chronic ulcer of other part of unspecified foot with unspecified severity: Secondary | ICD-10-CM

## 2013-03-29 LAB — COMPREHENSIVE METABOLIC PANEL
AST: 21 U/L (ref 0–37)
Albumin: 3.9 g/dL (ref 3.5–5.2)
Alkaline Phosphatase: 55 U/L (ref 39–117)
BUN: 17 mg/dL (ref 6–23)
Creatinine, Ser: 1.2 mg/dL (ref 0.4–1.2)
Potassium: 3.6 mEq/L (ref 3.5–5.1)
Total Bilirubin: 1.5 mg/dL — ABNORMAL HIGH (ref 0.3–1.2)

## 2013-03-29 MED ORDER — METFORMIN HCL 500 MG PO TABS: ORAL_TABLET | ORAL | Status: DC

## 2013-03-29 MED ORDER — AMOXICILLIN-POT CLAVULANATE 875-125 MG PO TABS: 1.0000 | ORAL_TABLET | Freq: Two times a day (BID) | ORAL | Status: DC

## 2013-03-29 MED ORDER — LOSARTAN POTASSIUM-HCTZ 100-12.5 MG PO TABS: 1.0000 | ORAL_TABLET | Freq: Every day | ORAL | Status: DC

## 2013-03-29 NOTE — Progress Notes (Signed)
Subjective:    Patient ID: Kristina Bennett, female    DOB: 1931/03/10, 79 y.o.   MRN: [id number]  HPI  Here for f/u of chronic health problems  Wt is down 4 lb with bmi of 32 Is eating less-not as hungry as she used to be and her husband used to eat a lot and make her eat  Is doing well overall  Life is much calmer - since her husband passed away    Diabetes Home sugar results - most of the time 120s-140s in am , one sugar in eve was 197 after eating popcorn but usually much lower  DM diet - knows she should watch sweets and carbs - but does eat some ice cream  Exercise - is active more lately- gets outdoors and does some walking with a walker  Symptoms- none  A1C last  Lab Results  Component Value Date   HGBA1C 6.0 06/21/2012   Due for a check No problems with medications  Renal protection on arb Last eye exam - over year ago -will make that appt   Flu shot given today  bp is stable today  No cp or palpitations or headaches or edema  No side effects to medicines  BP Readings from Last 3 Encounters:  03/29/13 140/72  06/27/12 134/76  11/25/11 140/62      Lab Results  Component Value Date   CHOL 231* 06/21/2012   HDL 49.20 06/21/2012   LDLCALC 114* 09/03/2009   LDLDIRECT 166.5 06/21/2012   TRIG 115.0 06/21/2012   CHOLHDL 5 06/21/2012      wants me to check out a toe with a sore on it  Went to UC for this in may and was given abx ointment -- given augmentin and sulfa or either   Review of Systems Review of Systems  Constitutional: Negative for fever, appetite change, fatigue and unexpected weight change.  Eyes: Negative for pain and visual disturbance.  Respiratory: Negative for cough and shortness of breath.   Cardiovascular: Negative for cp or palpitations    Gastrointestinal: Negative for nausea, diarrhea and constipation.  Genitourinary: Negative for urgency and frequency.  Skin: Negative for pallor or rash   Neurological: Negative for weakness,  light-headedness, numbness and headaches.  Hematological: Negative for adenopathy. Does not bruise/bleed easily.  Psychiatric/Behavioral: Negative for dysphoric mood. The patient is not nervous/anxious.         Objective:   Physical Exam  Constitutional: She appears well-developed and well-nourished. No distress.  obese and well appearing   HENT:  Head: Normocephalic and atraumatic.  Mouth/Throat: Oropharynx is clear and moist.  Eyes: Conjunctivae and EOM are normal. Pupils are equal, round, and reactive to light. No scleral icterus.  Neck: Normal range of motion. Neck supple. No JVD present. Carotid bruit is not present. No thyromegaly present.  Large mass in R neck is larger  Cardiovascular: Normal rate, regular rhythm, normal heart sounds and intact distal pulses.  Exam reveals no gallop.   Pulmonary/Chest: Effort normal and breath sounds normal. No respiratory distress. She has no wheezes. She has no rales.  Abdominal: Soft. Bowel sounds are normal. She exhibits no distension. There is no tenderness.  Musculoskeletal: She exhibits no edema.  Lymphadenopathy:    She has no cervical adenopathy.  Neurological: She is alert. She has normal reflexes. No cranial nerve deficit. She exhibits normal muscle tone. Coordination normal.  Skin: Skin is warm and dry. There is erythema.  R 2nd toe- callus is slt ulcerated  with scant collar of redness at tip of toe/ no drainage  Psychiatric: She has a normal mood and affect.          Assessment & Plan:

## 2013-03-29 NOTE — Patient Instructions (Addendum)
Don't forget to schedule your annual eye exam  Take the augmentin for your toe- update me if no improvement and keep it very clean Flu shot today  Labs today  Follow up in 6 months

## 2013-03-30 NOTE — Assessment & Plan Note (Signed)
Mild and per family recurrent - requiring abx in the past  Disc off loading pressure on that toe with a corn pad Cover with augmentin Disc care/hygiene Update if not starting to improve in a week or if worsening

## 2013-03-30 NOTE — Assessment & Plan Note (Signed)
bp in fair control at this time  No changes needed  Disc lifstyle change with low sodium diet and exercise   

## 2013-03-30 NOTE — Assessment & Plan Note (Signed)
A1c today Some wt lost and diet has improved Unable to inc exercise Flu shot today

## 2013-03-31 ENCOUNTER — Encounter: Payer: Self-pay | Admitting: *Deleted

## 2013-04-18 ENCOUNTER — Telehealth: Payer: Self-pay | Admitting: Family Medicine

## 2013-04-18 DIAGNOSIS — Z0279 Encounter for issue of other medical certificate: Secondary | ICD-10-CM

## 2013-04-18 NOTE — Telephone Encounter (Signed)
Form placed in your inbox

## 2013-04-18 NOTE — Telephone Encounter (Signed)
Ian Malkin (pt's daughter) dropped off [employer] forms for her mother for Dr. Milinda Antis to complete. Bonita Quin would like the forms mailed back to her due to living in [place]. The best number to reach Bonita Quin is [phone].  [address] [place], [place] [address]  Thanks, Revonda Standard

## 2013-07-24 ENCOUNTER — Other Ambulatory Visit: Payer: Self-pay | Admitting: Family Medicine

## 2013-08-30 ENCOUNTER — Other Ambulatory Visit: Payer: Self-pay | Admitting: *Deleted

## 2013-08-30 NOTE — Telephone Encounter (Signed)
Please refill for a year thanks 

## 2013-08-30 NOTE — Telephone Encounter (Signed)
Last office visit 04/18/2013.  Last filled 08/01/2012 for #10 with no refills.  Ok to refill.  [facility] is requesting 90 day supply.

## 2013-08-31 MED ORDER — TOLTERODINE TARTRATE ER 4 MG PO CP24
4.0000 mg | ORAL_CAPSULE | Freq: Every day | ORAL | Status: DC
Start: ? — End: 2014-08-20

## 2013-10-17 ENCOUNTER — Telehealth: Payer: Self-pay | Admitting: Family Medicine

## 2013-10-17 DIAGNOSIS — I10 Essential (primary) hypertension: Secondary | ICD-10-CM

## 2013-10-17 DIAGNOSIS — E119 Type 2 diabetes mellitus without complications: Secondary | ICD-10-CM

## 2013-10-17 DIAGNOSIS — N259 Disorder resulting from impaired renal tubular function, unspecified: Secondary | ICD-10-CM

## 2013-10-17 DIAGNOSIS — E7800 Pure hypercholesterolemia, unspecified: Secondary | ICD-10-CM

## 2013-10-17 NOTE — Telephone Encounter (Signed)
Message copied by Judy Pimple on Tue Oct 17, 2013  8:00 AM ------      Message from: Alvina Chou      Created: Mon Oct 09, 2013 12:47 PM      Regarding: Lab orders for Wednesday, 4.1.15       Lab orders for f/u ------

## 2013-10-18 ENCOUNTER — Other Ambulatory Visit (INDEPENDENT_AMBULATORY_CARE_PROVIDER_SITE_OTHER): Payer: Medicare Other

## 2013-10-18 DIAGNOSIS — E7800 Pure hypercholesterolemia, unspecified: Secondary | ICD-10-CM

## 2013-10-18 DIAGNOSIS — N259 Disorder resulting from impaired renal tubular function, unspecified: Secondary | ICD-10-CM

## 2013-10-18 DIAGNOSIS — E119 Type 2 diabetes mellitus without complications: Secondary | ICD-10-CM

## 2013-10-18 DIAGNOSIS — I10 Essential (primary) hypertension: Secondary | ICD-10-CM

## 2013-10-18 LAB — CBC WITH DIFFERENTIAL/PLATELET
BASOS ABS: 0 10*3/uL (ref 0.0–0.1)
Basophils Relative: 0.4 % (ref 0.0–3.0)
EOS ABS: 0.2 10*3/uL (ref 0.0–0.7)
Eosinophils Relative: 3.9 % (ref 0.0–5.0)
HEMATOCRIT: 33.5 % — AB (ref 36.0–46.0)
HEMOGLOBIN: 11.5 g/dL — AB (ref 12.0–15.0)
LYMPHS ABS: 1.6 10*3/uL (ref 0.7–4.0)
Lymphocytes Relative: 30.5 % (ref 12.0–46.0)
MCHC: 34.4 g/dL (ref 30.0–36.0)
MCV: 92.9 fl (ref 78.0–100.0)
MONO ABS: 0.5 10*3/uL (ref 0.1–1.0)
MONOS PCT: 8.9 % (ref 3.0–12.0)
NEUTROS ABS: 2.9 10*3/uL (ref 1.4–7.7)
Neutrophils Relative %: 56.3 % (ref 43.0–77.0)
PLATELETS: 136 10*3/uL — AB (ref 150.0–400.0)
RBC: 3.61 Mil/uL — ABNORMAL LOW (ref 3.87–5.11)
RDW: 13.9 % (ref 11.5–14.6)
WBC: 5.2 10*3/uL (ref 4.5–10.5)

## 2013-10-18 LAB — COMPREHENSIVE METABOLIC PANEL
ALK PHOS: 52 U/L (ref 39–117)
ALT: 14 U/L (ref 0–35)
AST: 21 U/L (ref 0–37)
Albumin: 3.9 g/dL (ref 3.5–5.2)
BILIRUBIN TOTAL: 1.4 mg/dL — AB (ref 0.3–1.2)
BUN: 19 mg/dL (ref 6–23)
CO2: 28 mEq/L (ref 19–32)
CREATININE: 1.2 mg/dL (ref 0.4–1.2)
Calcium: 9.4 mg/dL (ref 8.4–10.5)
Chloride: 103 mEq/L (ref 96–112)
GFR: 44.11 mL/min — AB (ref >60.00)
Glucose, Bld: 101 mg/dL — ABNORMAL HIGH (ref 70–99)
Potassium: 4.2 mEq/L (ref 3.5–5.1)
SODIUM: 137 meq/L (ref 135–145)
TOTAL PROTEIN: 7.1 g/dL (ref 6.0–8.3)

## 2013-10-18 LAB — HEMOGLOBIN A1C: HEMOGLOBIN A1C: 6.1 % (ref 4.6–6.5)

## 2013-10-18 LAB — LIPID PANEL
CHOLESTEROL: 190 mg/dL (ref 0–200)
HDL: 51.2 mg/dL (ref >39.00)
LDL Cholesterol: 125 mg/dL — ABNORMAL HIGH (ref 0–99)
Total CHOL/HDL Ratio: 4
Triglycerides: 69 mg/dL (ref 0.0–149.0)
VLDL: 13.8 mg/dL (ref 0.0–40.0)

## 2013-11-01 ENCOUNTER — Ambulatory Visit (INDEPENDENT_AMBULATORY_CARE_PROVIDER_SITE_OTHER): Payer: Medicare Other | Admitting: Family Medicine

## 2013-11-01 ENCOUNTER — Encounter: Payer: Self-pay | Admitting: Family Medicine

## 2013-11-01 VITALS — BP 140/78 | HR 83 | Temp 97.9°F | Ht 61.5 in | Wt 178.0 lb

## 2013-11-01 DIAGNOSIS — L84 Corns and callosities: Secondary | ICD-10-CM | POA: Insufficient documentation

## 2013-11-01 DIAGNOSIS — R221 Localized swelling, mass and lump, neck: Secondary | ICD-10-CM

## 2013-11-01 DIAGNOSIS — N259 Disorder resulting from impaired renal tubular function, unspecified: Secondary | ICD-10-CM

## 2013-11-01 DIAGNOSIS — E119 Type 2 diabetes mellitus without complications: Secondary | ICD-10-CM

## 2013-11-01 DIAGNOSIS — I10 Essential (primary) hypertension: Secondary | ICD-10-CM

## 2013-11-01 DIAGNOSIS — R220 Localized swelling, mass and lump, head: Secondary | ICD-10-CM

## 2013-11-01 DIAGNOSIS — E7800 Pure hypercholesterolemia, unspecified: Secondary | ICD-10-CM

## 2013-11-01 DIAGNOSIS — D649 Anemia, unspecified: Secondary | ICD-10-CM

## 2013-11-01 NOTE — Progress Notes (Signed)
Subjective:    Patient ID: Kristina Bennett, female    DOB: [birth year], 80 y.o.   MRN: [id number]  HPI Here for f/u of chronic medical problems  Doing ok overall - taking care of herself the best she can   The mass in her neck is starting to bother her more -is enlarging and now causing ear problems  She is afraid of surgery Has seen Dr Napoleon Form is up 3 lb with bmi of 33- she feels like her wt is pretty stable   bp is stable today  No cp or palpitations or headaches or edema  No side effects to medicines  BP Readings from Last 3 Encounters:  11/01/13 140/78  03/29/13 140/72  06/27/12 134/76     Diabetes Home sugar results -no low sugars - very well controlled  DM diet - is fair  Exercise - gets out more now and walks around since her husband died and uses the stairs  Symptoms- none  A1C last  Lab Results  Component Value Date   HGBA1C 6.1 10/18/2013   Was 6.2 last time  No problems with medications  Renal protection- on ARB  Last eye exam - has an appt on 11/29/13   Hyperlipidemia Lab Results  Component Value Date   CHOL 190 10/18/2013   CHOL 231* 06/21/2012   CHOL 212* 11/18/2011   Lab Results  Component Value Date   HDL 51.20 10/18/2013   HDL 49.20 06/21/2012   HDL 55.90 11/18/2011   Lab Results  Component Value Date   LDLCALC 125* 10/18/2013   LDLCALC 114* 09/03/2009   LDLCALC 128* 09/07/2008   Lab Results  Component Value Date   TRIG 69.0 10/18/2013   TRIG 115.0 06/21/2012   TRIG 126.0 11/18/2011   Lab Results  Component Value Date   CHOLHDL 4 10/18/2013   CHOLHDL 5 06/21/2012   CHOLHDL 4 11/18/2011   Lab Results  Component Value Date   LDLDIRECT 166.5 06/21/2012   LDLDIRECT 141.0 11/18/2011   LDLDIRECT 144.1 06/12/2011   on zetia Is getting better about fried food and fatty foods - but cheats occas with fried chicken   Chronic anemia stable Lab Results  Component Value Date   WBC 5.2 10/18/2013   HGB 11.5* 10/18/2013   HCT 33.5* 10/18/2013   MCV 92.9 10/18/2013     PLT 136.0* 10/18/2013      Falls-none - she uses her walker    Mood - is overall good - talks to family every day and staying motivated and chipper   Patient Active Problem List   Diagnosis Date Noted  . Pre-ulcerative calluses 11/01/2013  . Toe ulcer due to DM 03/29/2013  . EDEMA 11/14/2009  . RENAL INSUFFICIENCY 09/24/2009  . VARICOSE VEINS, LOWER EXTREMITIES 12/05/2008  . NECK MASS 11/22/2007  . UNSPECIFIED ANEMIA 07/05/2007  . DIABETES MELLITUS, TYPE II 04/08/2007  . NEPHROPATHY, DIABETIC 04/08/2007  . DIABETIC PERIPHERAL NEUROPATHY 04/08/2007  . HYPERCHOLESTEROLEMIA 04/08/2007  . MACULAR DEGENERATION 04/08/2007  . HYPERTENSION 04/08/2007  . RHEUMATOID ARTHRITIS 04/08/2007  . ADHESIVE CAPSULITIS, BILATERAL 04/08/2007  . URINARY INCONTINENCE 04/08/2007  . NEPHROLITHIASIS, HX OF 04/08/2007   Past Medical History  Diagnosis Date  . DM2 (diabetes mellitus, type 2)   . HTN (hypertension)   . Nephrolithiasis     hx  . Rheumatoid arthritis(714.0)   . Urinary incontinence   . Anemia   . Umbilical hernia   . Macular degeneration   . Renal insufficiency  poss renal vasc dz  . Kidney congenitally absent, left     small  . Diabetes mellitus   . Rheumatoid arthritis(714.0)   . Anemia   . Macular degeneration   . Renal insufficiency    Past Surgical History  Procedure Laterality Date  . Cataract signs    . 411 echo      mild diastolic dysfunction EF 18%, slt pulm HTN    History  Substance Use Topics  . Smoking status: Never Smoker   . Smokeless tobacco: Not on file  . Alcohol Use: No   Family History  Problem Relation Age of Onset  . Heart attack Mother   . Prostate cancer Father    Allergies  Allergen Reactions  . Ace Inhibitors     REACTION: ? reaction  . Atorvastatin     REACTION: ? reaction  . Etodolac     REACTION: swelling and dizziness  . Glipizide     REACTION: low sugar  . Metformin     REACTION: inc cr  . Metoprolol Tartrate      REACTION: ? reaction  . Propoxyphene N-Acetaminophen     REACTION: ? reaction  . Rosuvastatin     REACTION: ? reaction  . Tolterodine Tartrate     REACTION: ? reaction   Current Outpatient Prescriptions on File Prior to Visit  Medication Sig Dispense Refill  . Blood Glucose Monitoring Suppl (CONTOUR BLOOD GLUCOSE SYSTEM) W/DEVICE KIT Use to check blood sugar once daily       . Cholecalciferol (VITAMIN D) 2000 UNITS CAPS Take 1 capsule by mouth daily.        . folic acid (FOLVITE) 1 MG tablet Take 1 mg by mouth daily.        Marland Kitchen gabapentin (NEURONTIN) 300 MG capsule Take two capsules two times daily  360 capsule  3  . glucose blood (BAYER CONTOUR TEST) test strip 1 each by Other route daily. Use as instructed       . losartan-hydrochlorothiazide (HYZAAR) 100-12.5 MG per tablet Take 1 tablet by mouth daily.  90 tablet  3  . metFORMIN (GLUCOPHAGE) 500 MG tablet Take 2 tablets by mouth twice daily  180 tablet  3  . tolterodine (DETROL LA) 4 MG 24 hr capsule Take 1 capsule (4 mg total) by mouth daily.  90 capsule  3  . ZETIA 10 MG tablet TAKE 1 TABLET DAILY  90 tablet  2  . [DISCONTINUED] tolterodine (DETROL LA) 4 MG 24 hr capsule Take 1 capsule (4 mg total) by mouth daily.  90 capsule  3   No current facility-administered medications on file prior to visit.     Review of Systems Review of Systems  Constitutional: Negative for fever, appetite change, fatigue and unexpected weight change.  ENT pos for ear discomfort from mass in her neck Eyes: Negative for pain and visual disturbance.  Respiratory: Negative for cough and shortness of breath.   Cardiovascular: Negative for cp or palpitations    Gastrointestinal: Negative for nausea, diarrhea and constipation.  Genitourinary: Negative for urgency and frequency.  Skin: Negative for pallor or rash   Neurological: Negative for weakness, light-headedness, numbness and headaches.  Hematological: Negative for adenopathy. Does not bruise/bleed  easily.  Psychiatric/Behavioral: Negative for dysphoric mood. The patient is not nervous/anxious.         Objective:   Physical Exam  Constitutional: She appears well-developed and well-nourished. No distress.  obese and well appearing   HENT:  Head: Normocephalic  and atraumatic.  Right Ear: External ear normal.  Left Ear: External ear normal.  Nose: Nose normal.  Mouth/Throat: Oropharynx is clear and moist.  Eyes: Conjunctivae and EOM are normal. Pupils are equal, round, and reactive to light. Right eye exhibits no discharge. Left eye exhibits no discharge. No scleral icterus.  Neck: Normal range of motion. Neck supple. No JVD present. No thyromegaly present.  Very large mass in R neck - this extends up towards the ear moreso from last visit  nontender  Cardiovascular: Normal rate, regular rhythm, normal heart sounds and intact distal pulses.  Exam reveals no gallop.   Pulmonary/Chest: Effort normal and breath sounds normal. No respiratory distress. She has no wheezes. She has no rales.  Abdominal: Soft. Bowel sounds are normal. She exhibits no distension and no mass. There is no tenderness.  Musculoskeletal: She exhibits no edema and no tenderness.  Lymphadenopathy:    She has no cervical adenopathy.  Neurological: She is alert. She has normal reflexes. No cranial nerve deficit. She exhibits normal muscle tone. Coordination normal.  Skin: Skin is warm and dry. No rash noted. No erythema. No pallor.  Psychiatric: She has a normal mood and affect.          Assessment & Plan:

## 2013-11-01 NOTE — Patient Instructions (Signed)
Labs are overall stable  Stay active and keep watching your diet  Stop at check out for your referral to ENT  Follow up in 6 months for annual exam with labs prior

## 2013-11-01 NOTE — Progress Notes (Signed)
Pre visit review using our clinic review tool, if applicable. No additional management support is needed unless otherwise documented below in the visit note. 

## 2013-11-02 ENCOUNTER — Telehealth: Payer: Self-pay | Admitting: Family Medicine

## 2013-11-02 NOTE — Assessment & Plan Note (Signed)
Ref to ENT -pt is ready to again disc tx opt since she is now more symptomatic

## 2013-11-02 NOTE — Assessment & Plan Note (Signed)
Lab Results  Component Value Date   HGBA1C 6.1 10/18/2013    Overall continues to be in good control w/o hypoglycemia Stressed imp of wt loss for better health

## 2013-11-02 NOTE — Assessment & Plan Note (Signed)
Disc goals for lipids and reasons to control them Rev labs with pt Rev low sat fat diet in detail  Pt intol of statins and will continue zetia

## 2013-11-02 NOTE — Telephone Encounter (Signed)
Relevant patient education mailed to patient.  

## 2013-11-02 NOTE — Assessment & Plan Note (Signed)
Chemistry      Component Value Date/Time   NA 137 10/18/2013 1443   K 4.2 10/18/2013 1443   CL 103 10/18/2013 1443   CO2 28 10/18/2013 1443   BUN 19 10/18/2013 1443   CREATININE 1.2 10/18/2013 1443      Component Value Date/Time   CALCIUM 9.4 10/18/2013 1443   ALKPHOS 52 10/18/2013 1443   AST 21 10/18/2013 1443   ALT 14 10/18/2013 1443   BILITOT 1.4* 10/18/2013 1443     overall stable - disc imp of good water intake and avoidance of nsaid/ control of DM

## 2013-11-02 NOTE — Assessment & Plan Note (Signed)
Stable anemia of chronic dz Will continue to follow

## 2013-11-02 NOTE — Assessment & Plan Note (Signed)
This is improved but persistent Disc foot care Px written for Dm shoes with inserts

## 2013-11-02 NOTE — Assessment & Plan Note (Signed)
bp in fair control at this time  BP Readings from Last 1 Encounters:  11/01/13 140/78   No changes needed Disc lifstyle change with low sodium diet and exercise  Lab reviewed

## 2013-11-15 ENCOUNTER — Telehealth: Payer: Self-pay

## 2013-11-15 NOTE — Telephone Encounter (Signed)
Relevant patient education mailed to patient.  

## 2013-11-17 ENCOUNTER — Other Ambulatory Visit: Payer: Self-pay | Admitting: Family Medicine

## 2013-11-17 NOTE — Telephone Encounter (Signed)
Will refill electronically  

## 2013-11-17 NOTE — Telephone Encounter (Signed)
Electronic refill request, please advise  

## 2013-12-12 ENCOUNTER — Ambulatory Visit: Payer: Self-pay | Admitting: Otolaryngology

## 2013-12-20 ENCOUNTER — Ambulatory Visit (INDEPENDENT_AMBULATORY_CARE_PROVIDER_SITE_OTHER): Payer: Medicare Other | Admitting: Family Medicine

## 2013-12-20 ENCOUNTER — Encounter: Payer: Self-pay | Admitting: Family Medicine

## 2013-12-20 VITALS — BP 124/76 | HR 83 | Temp 97.6°F | Ht 61.5 in | Wt 177.2 lb

## 2013-12-20 DIAGNOSIS — Z01818 Encounter for other preprocedural examination: Secondary | ICD-10-CM

## 2013-12-20 DIAGNOSIS — I10 Essential (primary) hypertension: Secondary | ICD-10-CM

## 2013-12-20 DIAGNOSIS — Z01810 Encounter for preprocedural cardiovascular examination: Secondary | ICD-10-CM

## 2013-12-20 NOTE — Progress Notes (Signed)
Pre visit review using our clinic review tool, if applicable. No additional management support is needed unless otherwise documented below in the visit note. 

## 2013-12-20 NOTE — Progress Notes (Signed)
Subjective:    Patient ID: Kristina Bennett, female    DOB: May 23, 1931, 80 y.o.   MRN: [id number]  HPI Here for surgical clearance for removal for neck mass  Pending CT report from [facility]  With Dr Willeen Cass   The mass is now uncomfortable and interfering with daily life   Surgery in the past- had a breast tumor removed B66 at 80 years old Also had ovary removed for a cyst and a D and C Vein stripping  Appendectomy   No trouble with anesthesia except nausea Had a side effect to a narcotic once -does not remember what symptoms she had  Never smoked No asthma or copd  No heart problems  EKG is stable with rate of 79 NSR- with partial RBBB- stable from the past   bp is stable today  No cp or palpitations or headaches or edema  No side effects to medicines  BP Readings from Last 3 Encounters:  12/20/13 124/76  11/01/13 140/78  03/29/13 140/72    bp is well controlled  DM sugar is well controlled   Renal insufficiency   Chemistry      Component Value Date/Time   NA 137 10/18/2013 1443   K 4.2 10/18/2013 1443   CL 103 10/18/2013 1443   CO2 28 10/18/2013 1443   BUN 19 10/18/2013 1443   CREATININE 1.2 10/18/2013 1443      Component Value Date/Time   CALCIUM 9.4 10/18/2013 1443   ALKPHOS 52 10/18/2013 1443   AST 21 10/18/2013 1443   ALT 14 10/18/2013 1443   BILITOT 1.4* 10/18/2013 1443     last labs stable -has seen Dr Thedore Mins   Review of Systems Review of Systems  Constitutional: Negative for fever, appetite change, fatigue and unexpected weight change.  Eyes: Negative for pain and visual disturbance.  ENT pos for large mass on R neck that is now bothersome  Respiratory: Negative for cough and shortness of breath.   Cardiovascular: Negative for cp or palpitations    Gastrointestinal: Negative for nausea, diarrhea and constipation.  Genitourinary: Negative for urgency and frequency.  Skin: Negative for pallor or rash   Neurological: Negative for weakness, light-headedness, numbness  and headaches.  Hematological: Negative for adenopathy. Does not bruise/bleed easily. pos for large mass on neck Psychiatric/Behavioral: Negative for dysphoric mood. The patient is not nervous/anxious.         Objective:   Physical Exam  Constitutional: She appears well-developed and well-nourished. No distress.  obese and well appearing   HENT:  Head: Normocephalic and atraumatic.  Right Ear: External ear normal.  Left Ear: External ear normal.  Nose: Nose normal.  Mouth/Throat: Oropharynx is clear and moist.  Eyes: Conjunctivae and EOM are normal. Pupils are equal, round, and reactive to light. Right eye exhibits no discharge. Left eye exhibits no discharge. No scleral icterus.  Neck: Normal range of motion. Neck supple. No tracheal deviation present. No thyromegaly present.  Large neck mass on R side of neck and under mandible that is firm and mobile  Cardiovascular: Normal rate, regular rhythm and intact distal pulses.   Murmur heard. Pulmonary/Chest: Effort normal and breath sounds normal. No respiratory distress. She has no wheezes. She has no rales. She exhibits no tenderness.  Abdominal: Soft. Bowel sounds are normal. She exhibits no distension and no mass. There is no tenderness.  Stable umbilical hernia that is reducible   Musculoskeletal: She exhibits no edema and no tenderness.  Neurological: She is alert.  She has normal reflexes. No cranial nerve deficit. She exhibits normal muscle tone. Coordination normal.  Skin: Skin is warm and dry. No rash noted. No erythema. No pallor.  Psychiatric: She has a normal mood and affect.          Assessment & Plan:   Problem List Items Addressed This Visit     Cardiovascular and Mediastinum   HYPERTENSION      bp in fair control at this time  BP Readings from Last 1 Encounters:  12/20/13 124/76   No changes needed Disc lifstyle change with low sodium diet and exercise         Other   Preop examination     No  restrictions for upcoming neck surgery  Rev pt's med intol , has had nausea from anethesia in the past  Adv watching bp and also glucose  No blood thinners to stop Stable EKG Baseline heart M -last echo reviewed      Other Visit Diagnoses   Pre-operative cardiovascular examination    -  Primary    Relevant Orders       EKG 12-Lead (Completed)

## 2013-12-20 NOTE — Patient Instructions (Signed)
You are cleared for upcoming neck surgery  Take care of yourself and I will send a note to your surgeon

## 2013-12-21 NOTE — Assessment & Plan Note (Signed)
No restrictions for upcoming neck surgery  Rev pt's med intol , has had nausea from anethesia in the past  Adv watching bp and also glucose  No blood thinners to stop Stable EKG Baseline heart M -last echo reviewed

## 2013-12-21 NOTE — Assessment & Plan Note (Signed)
bp in fair control at this time  BP Readings from Last 1 Encounters:  12/20/13 124/76   No changes needed Disc lifstyle change with low sodium diet and exercise

## 2014-01-02 ENCOUNTER — Ambulatory Visit: Payer: Self-pay | Admitting: Otolaryngology

## 2014-01-02 LAB — BASIC METABOLIC PANEL
Anion Gap: 11 (ref 7–16)
BUN: 18 mg/dL (ref 7–18)
Calcium, Total: 9.3 mg/dL (ref 8.5–10.1)
Chloride: 102 mmol/L (ref 98–107)
Co2: 27 mmol/L (ref 21–32)
Creatinine: 1.24 mg/dL (ref 0.60–1.30)
GFR CALC AF AMER: 47 — AB
GFR CALC NON AF AMER: 41 — AB
Glucose: 197 mg/dL — ABNORMAL HIGH (ref 65–99)
OSMOLALITY: 287 (ref 275–301)
POTASSIUM: 3.3 mmol/L — AB (ref 3.5–5.1)
Sodium: 140 mmol/L (ref 136–145)

## 2014-01-10 ENCOUNTER — Ambulatory Visit: Payer: Self-pay | Admitting: Otolaryngology

## 2014-01-26 LAB — PATHOLOGY REPORT

## 2014-01-29 ENCOUNTER — Ambulatory Visit: Payer: Self-pay | Admitting: Radiation Oncology

## 2014-02-05 ENCOUNTER — Ambulatory Visit: Payer: Self-pay | Admitting: Radiation Oncology

## 2014-02-22 ENCOUNTER — Ambulatory Visit: Payer: Self-pay | Admitting: Radiation Oncology

## 2014-03-15 LAB — CBC CANCER CENTER
Basophil #: 0 x10 3/mm (ref 0.0–0.1)
Basophil %: 0.6 %
Eosinophil #: 0.2 x10 3/mm (ref 0.0–0.7)
Eosinophil %: 2.9 %
HCT: 34.7 % — ABNORMAL LOW (ref 35.0–47.0)
HGB: 11.8 g/dL — AB (ref 12.0–16.0)
Lymphocyte #: 1.8 x10 3/mm (ref 1.0–3.6)
Lymphocyte %: 28.3 %
MCH: 31.5 pg (ref 26.0–34.0)
MCHC: 33.9 g/dL (ref 32.0–36.0)
MCV: 93 fL (ref 80–100)
Monocyte #: 0.6 x10 3/mm (ref 0.2–0.9)
Monocyte %: 9.8 %
NEUTROS PCT: 58.4 %
Neutrophil #: 3.7 x10 3/mm (ref 1.4–6.5)
Platelet: 150 x10 3/mm (ref 150–440)
RBC: 3.74 10*6/uL — ABNORMAL LOW (ref 3.80–5.20)
RDW: 13.9 % (ref 11.5–14.5)
WBC: 6.4 x10 3/mm (ref 3.6–11.0)

## 2014-03-20 ENCOUNTER — Ambulatory Visit: Payer: Self-pay | Admitting: Radiation Oncology

## 2014-03-22 LAB — CBC CANCER CENTER
Basophil #: 0 x10 3/mm (ref 0.0–0.1)
Basophil %: 0.5 %
EOS ABS: 0.2 x10 3/mm (ref 0.0–0.7)
EOS PCT: 3.7 %
HCT: 34.6 % — AB (ref 35.0–47.0)
HGB: 11.8 g/dL — AB (ref 12.0–16.0)
LYMPHS ABS: 1.5 x10 3/mm (ref 1.0–3.6)
Lymphocyte %: 25.7 %
MCH: 31.9 pg (ref 26.0–34.0)
MCHC: 34.2 g/dL (ref 32.0–36.0)
MCV: 93 fL (ref 80–100)
Monocyte #: 0.6 x10 3/mm (ref 0.2–0.9)
Monocyte %: 10.6 %
Neutrophil #: 3.4 x10 3/mm (ref 1.4–6.5)
Neutrophil %: 59.5 %
Platelet: 142 x10 3/mm — ABNORMAL LOW (ref 150–440)
RBC: 3.71 10*6/uL — ABNORMAL LOW (ref 3.80–5.20)
RDW: 13.9 % (ref 11.5–14.5)
WBC: 5.8 x10 3/mm (ref 3.6–11.0)

## 2014-03-29 LAB — CBC CANCER CENTER
BASOS PCT: 0.6 %
Basophil #: 0 x10 3/mm (ref 0.0–0.1)
EOS ABS: 0.3 x10 3/mm (ref 0.0–0.7)
Eosinophil %: 4.1 %
HCT: 36.1 % (ref 35.0–47.0)
HGB: 12.2 g/dL (ref 12.0–16.0)
Lymphocyte #: 1.7 x10 3/mm (ref 1.0–3.6)
Lymphocyte %: 27.7 %
MCH: 31.3 pg (ref 26.0–34.0)
MCHC: 33.8 g/dL (ref 32.0–36.0)
MCV: 93 fL (ref 80–100)
Monocyte #: 0.7 x10 3/mm (ref 0.2–0.9)
Monocyte %: 10.6 %
NEUTROS PCT: 57 %
Neutrophil #: 3.5 x10 3/mm (ref 1.4–6.5)
PLATELETS: 165 x10 3/mm (ref 150–440)
RBC: 3.89 10*6/uL (ref 3.80–5.20)
RDW: 14.1 % (ref 11.5–14.5)
WBC: 6.2 x10 3/mm (ref 3.6–11.0)

## 2014-03-30 ENCOUNTER — Other Ambulatory Visit: Payer: Self-pay | Admitting: *Deleted

## 2014-03-30 MED ORDER — LOSARTAN POTASSIUM-HCTZ 100-12.5 MG PO TABS: 1.0000 | ORAL_TABLET | Freq: Every day | ORAL | Status: DC

## 2014-04-05 LAB — CBC CANCER CENTER
BASOS ABS: 0 x10 3/mm (ref 0.0–0.1)
Basophil %: 0.6 %
Eosinophil #: 0.2 x10 3/mm (ref 0.0–0.7)
Eosinophil %: 2.8 %
HCT: 37 % (ref 35.0–47.0)
HGB: 12.5 g/dL (ref 12.0–16.0)
LYMPHS ABS: 1.6 x10 3/mm (ref 1.0–3.6)
Lymphocyte %: 23.5 %
MCH: 31.6 pg (ref 26.0–34.0)
MCHC: 33.7 g/dL (ref 32.0–36.0)
MCV: 94 fL (ref 80–100)
MONOS PCT: 10 %
Monocyte #: 0.7 x10 3/mm (ref 0.2–0.9)
NEUTROS ABS: 4.3 x10 3/mm (ref 1.4–6.5)
NEUTROS PCT: 63.1 %
PLATELETS: 161 x10 3/mm (ref 150–440)
RBC: 3.95 10*6/uL (ref 3.80–5.20)
RDW: 13.7 % (ref 11.5–14.5)
WBC: 6.8 x10 3/mm (ref 3.6–11.0)

## 2014-04-10 ENCOUNTER — Other Ambulatory Visit: Payer: Self-pay | Admitting: Family Medicine

## 2014-04-10 MED ORDER — METFORMIN HCL 500 MG PO TABS: ORAL_TABLET | ORAL | Status: DC

## 2014-04-12 LAB — CBC CANCER CENTER
BASOS PCT: 0.6 %
Basophil #: 0 x10 3/mm (ref 0.0–0.1)
EOS PCT: 4.7 %
Eosinophil #: 0.2 x10 3/mm (ref 0.0–0.7)
HCT: 33.5 % — ABNORMAL LOW (ref 35.0–47.0)
HGB: 11.3 g/dL — ABNORMAL LOW (ref 12.0–16.0)
Lymphocyte #: 1.3 x10 3/mm (ref 1.0–3.6)
Lymphocyte %: 24.5 %
MCH: 31.7 pg (ref 26.0–34.0)
MCHC: 33.8 g/dL (ref 32.0–36.0)
MCV: 94 fL (ref 80–100)
MONO ABS: 0.6 x10 3/mm (ref 0.2–0.9)
Monocyte %: 11.6 %
NEUTROS ABS: 3.1 x10 3/mm (ref 1.4–6.5)
Neutrophil %: 58.6 %
Platelet: 133 x10 3/mm — ABNORMAL LOW (ref 150–440)
RBC: 3.58 10*6/uL — AB (ref 3.80–5.20)
RDW: 13.8 % (ref 11.5–14.5)
WBC: 5.3 x10 3/mm (ref 3.6–11.0)

## 2014-04-19 ENCOUNTER — Ambulatory Visit: Payer: Self-pay | Admitting: Radiation Oncology

## 2014-04-19 LAB — CBC CANCER CENTER
Basophil #: 0 x10 3/mm (ref 0.0–0.1)
Basophil %: 0.4 %
EOS ABS: 0.3 x10 3/mm (ref 0.0–0.7)
Eosinophil %: 6.2 %
HCT: 31.9 % — ABNORMAL LOW (ref 35.0–47.0)
HGB: 10.9 g/dL — ABNORMAL LOW (ref 12.0–16.0)
LYMPHS ABS: 0.7 x10 3/mm — AB (ref 1.0–3.6)
Lymphocyte %: 16.3 %
MCH: 31.8 pg (ref 26.0–34.0)
MCHC: 34.3 g/dL (ref 32.0–36.0)
MCV: 93 fL (ref 80–100)
Monocyte #: 0.6 x10 3/mm (ref 0.2–0.9)
Monocyte %: 14 %
NEUTROS ABS: 2.6 x10 3/mm (ref 1.4–6.5)
NEUTROS PCT: 63.1 %
Platelet: 154 x10 3/mm (ref 150–440)
RBC: 3.44 10*6/uL — ABNORMAL LOW (ref 3.80–5.20)
RDW: 13.6 % (ref 11.5–14.5)
WBC: 4.1 x10 3/mm (ref 3.6–11.0)

## 2014-05-01 ENCOUNTER — Encounter: Payer: Self-pay | Admitting: Family Medicine

## 2014-05-01 ENCOUNTER — Ambulatory Visit (INDEPENDENT_AMBULATORY_CARE_PROVIDER_SITE_OTHER): Payer: Medicare Other | Admitting: Family Medicine

## 2014-05-01 VITALS — BP 108/56 | HR 86 | Temp 97.8°F | Ht 62.0 in | Wt 167.2 lb

## 2014-05-01 DIAGNOSIS — E119 Type 2 diabetes mellitus without complications: Secondary | ICD-10-CM

## 2014-05-01 DIAGNOSIS — R221 Localized swelling, mass and lump, neck: Secondary | ICD-10-CM

## 2014-05-01 DIAGNOSIS — E7800 Pure hypercholesterolemia, unspecified: Secondary | ICD-10-CM

## 2014-05-01 DIAGNOSIS — E11621 Type 2 diabetes mellitus with foot ulcer: Secondary | ICD-10-CM

## 2014-05-01 DIAGNOSIS — Z0000 Encounter for general adult medical examination without abnormal findings: Secondary | ICD-10-CM

## 2014-05-01 DIAGNOSIS — I10 Essential (primary) hypertension: Secondary | ICD-10-CM

## 2014-05-01 DIAGNOSIS — N259 Disorder resulting from impaired renal tubular function, unspecified: Secondary | ICD-10-CM

## 2014-05-01 DIAGNOSIS — D649 Anemia, unspecified: Secondary | ICD-10-CM

## 2014-05-01 DIAGNOSIS — L97509 Non-pressure chronic ulcer of other part of unspecified foot with unspecified severity: Secondary | ICD-10-CM

## 2014-05-01 DIAGNOSIS — R220 Localized swelling, mass and lump, head: Secondary | ICD-10-CM

## 2014-05-01 DIAGNOSIS — Z23 Encounter for immunization: Secondary | ICD-10-CM

## 2014-05-01 MED ORDER — LOSARTAN POTASSIUM-HCTZ 100-12.5 MG PO TABS: 1.0000 | ORAL_TABLET | Freq: Every day | ORAL | Status: DC

## 2014-05-01 MED ORDER — MUPIROCIN 2 % EX OINT: 1.0000 "application " | TOPICAL_OINTMENT | Freq: Two times a day (BID) | CUTANEOUS | Status: DC

## 2014-05-01 MED ORDER — METFORMIN HCL 500 MG PO TABS: 500.0000 mg | ORAL_TABLET | Freq: Two times a day (BID) | ORAL | Status: DC

## 2014-05-01 NOTE — Progress Notes (Signed)
Subjective:    Patient ID: Kristina Bennett, female    DOB: 1931/04/05, 80 y.o.   MRN: [id number]  HPI I have personally reviewed the Medicare Annual Wellness questionnaire and have noted 1. The patient's medical and social history 2. Their use of alcohol, tobacco or illicit drugs 3. Their current medications and supplements 4. The patient's functional ability including ADL's, fall risks, home safety risks and hearing or visual             impairment. 5. Diet and physical activities 6. Evidence for depression or mood disorders  The patients weight, height, BMI have been recorded in the chart and visual acuity is per eye clinic.  I have made referrals, counseling and provided education to the patient based review of the above and I have provided the pt with a written personalized care plan for preventive services.  See scanned forms.  Routine anticipatory guidance given to patient.  See health maintenance. Colon cancer screening- does not want to do colonosc or ifob  Breast cancer screening- declines , and also declines breast exam  Self breast exam-does them and no changes  Flu vaccine- wants that today  Tetanus vaccine 11/12 Pneumovax 5/10 , wants prevnar vaccine today  Zoster vaccine- declines the vaccine   Advance directive- has that drawn up already  Cognitive function addressed- see scanned forms- and if abnormal then additional documentation follows. No worries about that   PMH and SH reviewed  Meds, vitals, and allergies reviewed.   ROS: See HPI.  Otherwise negative.     Wt is down 10 lb - not as much appetite and some loss of taste with radiation  bmi of 30  Of note top wt in 2011 was 192  Had surgery for mass in neck - it came out easily and her surgery only took 45 minutes  That was followed by 33 radiation treatments - is done - precautionary  Will have f/u in a month  Did great overall   DM Lab Results  Component Value Date   HGBA1C 6.1 10/18/2013  due for  that today   Hyperlipidemia  On zetia Intol of statins Lab Results  Component Value Date   CHOL 190 10/18/2013   HDL 51.20 10/18/2013   LDLCALC 125* 10/18/2013   LDLDIRECT 166.5 06/21/2012   TRIG 69.0 10/18/2013   CHOLHDL 4 10/18/2013   diet   Hx of anemia of chronic dz Lab Results  Component Value Date   WBC 5.2 10/18/2013   HGB 11.5* 10/18/2013   HCT 33.5* 10/18/2013   MCV 92.9 10/18/2013   PLT 136.0* 10/18/2013   with surgeon was 10.9 on Sept 29th  A bit more anemic after surgery     Hx of renal insuf   Chemistry      Component Value Date/Time   NA 137 10/18/2013 1443   K 4.2 10/18/2013 1443   CL 103 10/18/2013 1443   CO2 28 10/18/2013 1443   BUN 19 10/18/2013 1443   CREATININE 1.2 10/18/2013 1443      Component Value Date/Time   CALCIUM 9.4 10/18/2013 1443   ALKPHOS 52 10/18/2013 1443   AST 21 10/18/2013 1443   ALT 14 10/18/2013 1443   BILITOT 1.4* 10/18/2013 1443     she makes an attempt to drink enough water This helps her dry throat   bp is stable today  No cp or palpitations or headaches or edema  No side effects to medicines  BP Readings from  Last 3 Encounters:  05/01/14 108/56  12/20/13 124/76  11/01/13 140/78      Patient Active Problem List   Diagnosis Date Noted  . Encounter for Medicare annual wellness exam 05/01/2014  . Preop examination 12/20/2013  . Pre-ulcerative calluses 11/01/2013  . Toe ulcer due to DM 03/29/2013  . EDEMA 11/14/2009  . Disorder resulting from impaired renal function 09/24/2009  . VARICOSE VEINS, LOWER EXTREMITIES 12/05/2008  . NECK MASS 11/22/2007  . Anemia 07/05/2007  . Diabetes type 2, controlled 04/08/2007  . NEPHROPATHY, DIABETIC 04/08/2007  . DIABETIC PERIPHERAL NEUROPATHY 04/08/2007  . HYPERCHOLESTEROLEMIA 04/08/2007  . MACULAR DEGENERATION 04/08/2007  . Essential hypertension 04/08/2007  . RHEUMATOID ARTHRITIS 04/08/2007  . ADHESIVE CAPSULITIS, BILATERAL 04/08/2007  . URINARY INCONTINENCE 04/08/2007  . NEPHROLITHIASIS, HX OF 04/08/2007     Past Medical History  Diagnosis Date  . DM2 (diabetes mellitus, type 2)   . HTN (hypertension)   . Nephrolithiasis     hx  . Rheumatoid arthritis(714.0)   . Urinary incontinence   . Anemia   . Umbilical hernia   . Macular degeneration   . Renal insufficiency     poss renal vasc dz  . Kidney congenitally absent, left     small  . Diabetes mellitus   . Rheumatoid arthritis(714.0)   . Anemia   . Macular degeneration   . Renal insufficiency    Past Surgical History  Procedure Laterality Date  . Cataract signs    . 411 echo      mild diastolic dysfunction EF 03%, slt pulm HTN    History  Substance Use Topics  . Smoking status: Never Smoker   . Smokeless tobacco: Not on file  . Alcohol Use: No   Family History  Problem Relation Age of Onset  . Heart attack Mother   . Prostate cancer Father    Allergies  Allergen Reactions  . Ace Inhibitors     REACTION: ? reaction  . Atorvastatin     REACTION: ? reaction  . Etodolac     REACTION: swelling and dizziness  . Glipizide     REACTION: low sugar  . Metformin     REACTION: inc cr  . Metoprolol Tartrate     REACTION: ? reaction  . Propoxyphene N-Acetaminophen     REACTION: ? reaction  . Rosuvastatin     REACTION: ? reaction  . Tolterodine Tartrate     REACTION: ? reaction   Current Outpatient Prescriptions on File Prior to Visit  Medication Sig Dispense Refill  . B Complex-C (SUPER B COMPLEX PO) Take 1 tablet by mouth daily.      . Blood Glucose Monitoring Suppl (CONTOUR BLOOD GLUCOSE SYSTEM) W/DEVICE KIT Use to check blood sugar once daily       . Cholecalciferol (VITAMIN D) 2000 UNITS CAPS Take 1 capsule by mouth daily.        . folic acid (FOLVITE) 1 MG tablet Take 1 mg by mouth daily.        Marland Kitchen glucose blood (BAYER CONTOUR TEST) test strip 1 each by Other route daily. Use as instructed       . Iron Combinations (IRON COMPLEX PO) Take 65 mg by mouth daily.      Marland Kitchen tolterodine (DETROL LA) 4 MG 24 hr capsule  Take 1 capsule (4 mg total) by mouth daily.  90 capsule  3  . ZETIA 10 MG tablet TAKE 1 TABLET DAILY  90 tablet  2   No  current facility-administered medications on file prior to visit.    Review of Systems Review of Systems  Constitutional: Negative for fever, appetite change, fatigue and unexpected weight change.  Eyes: Negative for pain and visual disturbance.  Respiratory: Negative for cough and shortness of breath.   Cardiovascular: Negative for cp or palpitations    Gastrointestinal: Negative for nausea, diarrhea and constipation.  Genitourinary: Negative for urgency and frequency.  Skin: Negative for pallor or rash  pos for callus that ulcerates on toe/ recurrent Neurological: Negative for weakness, light-headedness, numbness and headaches.  Hematological: Negative for adenopathy. Does not bruise/bleed easily.  Psychiatric/Behavioral: Negative for dysphoric mood. The patient is not nervous/anxious.         Objective:   Physical Exam  Constitutional: She appears well-developed and well-nourished. No distress.  obese and well appearing   HENT:  Head: Normocephalic and atraumatic.  Right Ear: External ear normal.  Left Ear: External ear normal.  Nose: Nose normal.  Mouth/Throat: Oropharynx is clear and moist.  Eyes: Conjunctivae and EOM are normal. Pupils are equal, round, and reactive to light. Right eye exhibits no discharge. Left eye exhibits no discharge. No scleral icterus.  Neck: Normal range of motion. Neck supple. No JVD present. No thyromegaly present.  Cardiovascular: Normal rate, regular rhythm, normal heart sounds and intact distal pulses.  Exam reveals no gallop.   Pulmonary/Chest: Effort normal and breath sounds normal. No respiratory distress. She has no wheezes. She has no rales.  Abdominal: Soft. Bowel sounds are normal. She exhibits no distension and no mass. There is no tenderness.  Moderate sized umbilical hernia is unchanged   Genitourinary:  Declines  breast exam   Musculoskeletal: She exhibits no edema and no tenderness.  Lymphadenopathy:    She has no cervical adenopathy.  Neurological: She is alert. She has normal reflexes. No cranial nerve deficit. She exhibits normal muscle tone. Coordination normal.  Skin: Skin is warm and dry. No rash noted. No erythema. No pallor.  Stable callus with ulcer on toe R 3rd  No signs of infection   Psychiatric: She has a normal mood and affect.          Assessment & Plan:   Problem List Items Addressed This Visit     Cardiovascular and Mediastinum   Essential hypertension      bp in fair control at this time  BP Readings from Last 1 Encounters:  05/01/14 108/56   No changes needed Disc lifstyle change with low sodium diet and exercise  Labs reviewed     Relevant Medications      losartan-hydrochlorothiazide (HYZAAR) 100-12.5 MG per tablet   Other Relevant Orders      Basic metabolic panel (Completed)     Endocrine   Diabetes type 2, controlled      Lab Results  Component Value Date   HGBA1C 5.8 05/01/2014   Doing well overall and wt loss continues with better diet and self care     Relevant Medications      metFORMIN (GLUCOPHAGE) tablet      losartan-hydrochlorothiazide (HYZAAR) 100-12.5 MG per tablet   Other Relevant Orders      Hemoglobin A1c (Completed)   Toe ulcer due to DM     No signs of infection today - callus is more firm  Given px for bactroban ointment to continue using with pad  Continue DM shoes  Offered podiatry ref when ready to see if callus can be treated further and help offload pressure       Relevant Medications      metFORMIN (GLUCOPHAGE) tablet      losartan-hydrochlorothiazide (HYZAAR) 100-12.5 MG per tablet     Genitourinary   Disorder resulting from impaired renal function     Enc water intake Multifactorial  Labs today     Relevant Orders      Basic metabolic panel (Completed)     Other   HYPERCHOLESTEROLEMIA     Disc goals for lipids  and reasons to control them Rev labs with pt from last check Rev low sat fat diet in detail Lab today On zetia/intol of statin     Relevant Medications      losartan-hydrochlorothiazide (HYZAAR) 100-12.5 MG per tablet   Other Relevant Orders      Lipid panel (Completed)   Anemia     Anemia of chronic dz -worsened after surgery  Rev last labs  Continue to follow  No symptoms     NECK MASS   Encounter for Medicare annual wellness exam - Primary     Reviewed health habits including diet and exercise and skin cancer prevention Reviewed appropriate screening tests for age  Also reviewed health mt list, fam hx and immunization status , as well as social and family history   Rev last labs  Lab today  prevnar and flu shots today  Rev HPI      Other Visit Diagnoses   Need for prophylactic vaccination and inoculation against influenza        Relevant Orders       Flu Vaccine QUAD 36+ mos PF IM (Fluarix Quad PF) (Completed)    Need for vaccination with 13-polyvalent pneumococcal conjugate vaccine        Relevant Orders       Pneumococcal conjugate vaccine 13-valent (Completed)       

## 2014-05-01 NOTE — Patient Instructions (Signed)
Flu vaccine and prevnar vaccine today Labs today  Use bactroban ointment on toe up to twice daily  When you are ready to see a podiatrist call us if you need a referral   Keep taking good care of yourself   Follow up in 6 months with labs prior

## 2014-05-01 NOTE — Progress Notes (Signed)
Pre visit review using our clinic review tool, if applicable. No additional management support is needed unless otherwise documented below in the visit note. 

## 2014-05-02 LAB — BASIC METABOLIC PANEL
BUN: 21 mg/dL (ref 6–23)
CALCIUM: 9.7 mg/dL (ref 8.4–10.5)
CO2: 23 mEq/L (ref 19–32)
CREATININE: 2.1 mg/dL — AB (ref 0.4–1.2)
Chloride: 104 mEq/L (ref 96–112)
GFR: 23.47 mL/min — AB (ref >60.00)
Glucose, Bld: 114 mg/dL — ABNORMAL HIGH (ref 70–99)
Potassium: 4.3 mEq/L (ref 3.5–5.1)
SODIUM: 137 meq/L (ref 135–145)

## 2014-05-02 LAB — LIPID PANEL
CHOL/HDL RATIO: 6
Cholesterol: 219 mg/dL — ABNORMAL HIGH (ref 0–200)
HDL: 39.5 mg/dL (ref >39.00)
LDL Cholesterol: 152 mg/dL — ABNORMAL HIGH (ref 0–99)
NonHDL: 179.5
Triglycerides: 136 mg/dL (ref 0.0–149.0)
VLDL: 27.2 mg/dL (ref 0.0–40.0)

## 2014-05-02 LAB — HEMOGLOBIN A1C: Hgb A1c MFr Bld: 5.8 % (ref 4.6–6.5)

## 2014-05-03 NOTE — Assessment & Plan Note (Signed)
Anemia of chronic dz -worsened after surgery  Rev last labs  Continue to follow  No symptoms

## 2014-05-03 NOTE — Assessment & Plan Note (Signed)
Disc goals for lipids and reasons to control them Rev labs with pt from last check Rev low sat fat diet in detail Lab today On zetia/intol of statin

## 2014-05-03 NOTE — Assessment & Plan Note (Signed)
Enc water intake Multifactorial  Labs today

## 2014-05-03 NOTE — Assessment & Plan Note (Signed)
Reviewed health habits including diet and exercise and skin cancer prevention Reviewed appropriate screening tests for age  Also reviewed health mt list, fam hx and immunization status , as well as social and family history   Rev last labs  Lab today  prevnar and flu shots today  Rev HPI

## 2014-05-03 NOTE — Assessment & Plan Note (Signed)
No signs of infection today - callus is more firm  Given px for bactroban ointment to continue using with pad  Continue DM shoes  Offered podiatry ref when ready to see if callus can be treated further and help offload pressure

## 2014-05-03 NOTE — Assessment & Plan Note (Signed)
bp in fair control at this time  BP Readings from Last 1 Encounters:  05/01/14 108/56   No changes needed Disc lifstyle change with low sodium diet and exercise  Labs reviewed

## 2014-05-03 NOTE — Assessment & Plan Note (Signed)
Lab Results  Component Value Date   HGBA1C 5.8 05/01/2014   Doing well overall and wt loss continues with better diet and self care

## 2014-05-10 ENCOUNTER — Encounter: Payer: Self-pay | Admitting: *Deleted

## 2014-05-20 ENCOUNTER — Ambulatory Visit: Payer: Self-pay | Admitting: Radiation Oncology

## 2014-05-25 ENCOUNTER — Other Ambulatory Visit: Payer: Self-pay | Admitting: Family Medicine

## 2014-05-25 DIAGNOSIS — N289 Disorder of kidney and ureter, unspecified: Secondary | ICD-10-CM

## 2014-05-31 ENCOUNTER — Telehealth: Payer: Self-pay | Admitting: Family Medicine

## 2014-05-31 ENCOUNTER — Other Ambulatory Visit (INDEPENDENT_AMBULATORY_CARE_PROVIDER_SITE_OTHER): Payer: Medicare Other

## 2014-05-31 DIAGNOSIS — N259 Disorder resulting from impaired renal tubular function, unspecified: Secondary | ICD-10-CM

## 2014-05-31 LAB — RENAL FUNCTION PANEL
Albumin: 3.2 g/dL — ABNORMAL LOW (ref 3.5–5.2)
BUN: 21 mg/dL (ref 6–23)
CO2: 27 meq/L (ref 19–32)
Calcium: 9.5 mg/dL (ref 8.4–10.5)
Chloride: 107 mEq/L (ref 96–112)
Creatinine, Ser: 1.2 mg/dL (ref 0.4–1.2)
GFR: 44.45 mL/min — ABNORMAL LOW (ref >60.00)
GLUCOSE: 101 mg/dL — AB (ref 70–99)
Phosphorus: 3.4 mg/dL (ref 2.3–4.6)
Potassium: 4.3 mEq/L (ref 3.5–5.1)
Sodium: 140 mEq/L (ref 135–145)

## 2014-05-31 NOTE — Telephone Encounter (Signed)
Daughter brings in Kristina Bennett this morning for her lab work. She wants to notify that on 02/05/14 she had a PET scan at [facility] with contrast and daughter thinks that is probably what has affected the kidney function. Since stopping the metformin her sugars have gone up and her toe ulcers are bothering her. Daughter wants to know if we can get her back on the metformin?

## 2014-05-31 NOTE — Telephone Encounter (Signed)
Thanks for letting me know - I will see how her labs look and advise from there

## 2014-05-31 NOTE — Telephone Encounter (Signed)
See prev note. Pt's labs are back

## 2014-05-31 NOTE — Telephone Encounter (Signed)
Cr is much improved - this could argue for or against the contrast theory Re start the metformin but also drink lots of water  r echeck renal profile in 2 weeks please for renal insufficiency - let's see how it does

## 2014-06-01 NOTE — Telephone Encounter (Signed)
Pt's daughter notified of labs and Dr. Royden Purl comments. Pt will start back on metformin and lab appt scheduled to recheck renal profile

## 2014-06-13 ENCOUNTER — Other Ambulatory Visit: Payer: Self-pay | Admitting: Family Medicine

## 2014-06-13 DIAGNOSIS — N259 Disorder resulting from impaired renal tubular function, unspecified: Secondary | ICD-10-CM

## 2014-06-20 ENCOUNTER — Other Ambulatory Visit (INDEPENDENT_AMBULATORY_CARE_PROVIDER_SITE_OTHER): Payer: Medicare Other

## 2014-06-20 DIAGNOSIS — N259 Disorder resulting from impaired renal tubular function, unspecified: Secondary | ICD-10-CM

## 2014-06-21 LAB — RENAL FUNCTION PANEL
ALBUMIN: 4 g/dL (ref 3.5–5.2)
BUN: 19 mg/dL (ref 6–23)
CO2: 26 meq/L (ref 19–32)
Calcium: 9.1 mg/dL (ref 8.4–10.5)
Chloride: 102 mEq/L (ref 96–112)
Creatinine, Ser: 1.2 mg/dL (ref 0.4–1.2)
GFR: 45.73 mL/min — ABNORMAL LOW (ref >60.00)
Glucose, Bld: 137 mg/dL — ABNORMAL HIGH (ref 70–99)
Phosphorus: 3.2 mg/dL (ref 2.3–4.6)
Potassium: 3.8 mEq/L (ref 3.5–5.1)
Sodium: 133 mEq/L — ABNORMAL LOW (ref 135–145)

## 2014-06-22 ENCOUNTER — Encounter: Payer: Self-pay | Admitting: *Deleted

## 2014-08-07 ENCOUNTER — Telehealth: Payer: Self-pay

## 2014-08-07 NOTE — Telephone Encounter (Signed)
Bonita Quin pt's daughter left v/m; [facility] sent request for additional info on 07/22/2014 on pts Detrol refill. Bonita Quin spoke with [facility] and [facility] request cb [phone]. Linda request cb.

## 2014-08-09 NOTE — Telephone Encounter (Signed)
Called and their office doesn't open until 9am, will call back later

## 2014-08-20 MED ORDER — TOLTERODINE TARTRATE ER 4 MG PO CP24: 4.0000 mg | ORAL_CAPSULE | Freq: Every day | ORAL | Status: DC

## 2014-08-20 NOTE — Telephone Encounter (Signed)
Please refill for a year-send to her mail order thanks

## 2014-08-20 NOTE — Telephone Encounter (Signed)
Sent in rx.

## 2014-08-20 NOTE — Telephone Encounter (Signed)
Called [facility] and told that the medication was void. She did not give me a reason why but just stated that physician can re-send it. Do we go ahead and refill?

## 2014-09-17 ENCOUNTER — Telehealth: Payer: Self-pay | Admitting: Family Medicine

## 2014-09-17 NOTE — Telephone Encounter (Signed)
Bonita Quin (daughter) dropped off forms from insurance company that need to be filled out regarding Tolterodine Tart.  Please fax form directly to [phone]. If you you have any questions call daughter at [phone] or [phone]. Placing on [employer]

## 2014-09-18 NOTE — Telephone Encounter (Signed)
Questioning this form. The rx was already sent to [facility] on 08/20/14. Left voicemail for daughter to call back.

## 2014-09-19 MED ORDER — TOLTERODINE TARTRATE ER 4 MG PO CP24: 4.0000 mg | ORAL_CAPSULE | Freq: Every day | ORAL | Status: DC

## 2014-09-19 NOTE — Telephone Encounter (Signed)
Kristina Bennett, daughter returned call re: medication 55 rx mail order for her mother.

## 2014-09-19 NOTE — Telephone Encounter (Signed)
Done

## 2014-09-19 NOTE — Telephone Encounter (Signed)
Kristina Bennett called back again. She stated that the tolterodine tart er caps 4 mg should [facility].

## 2014-09-20 NOTE — Telephone Encounter (Signed)
Bonita Quin called back. Let her know that Dr Milinda Antis [facility].

## 2014-09-20 NOTE — Telephone Encounter (Signed)
Left voicemail for Kristina Bennett to call back.

## 2014-10-02 ENCOUNTER — Ambulatory Visit
Admit: 2014-10-02 | Disposition: A | Discharge status: Home / Self Care | Payer: Self-pay | Attending: Radiation Oncology | Admitting: Radiation Oncology

## 2014-10-31 ENCOUNTER — Encounter: Payer: Self-pay | Admitting: Family Medicine

## 2014-10-31 ENCOUNTER — Ambulatory Visit (INDEPENDENT_AMBULATORY_CARE_PROVIDER_SITE_OTHER): Payer: Medicare Other | Admitting: Family Medicine

## 2014-10-31 VITALS — BP 138/72 | HR 95 | Temp 97.7°F | Ht 61.25 in | Wt 171.8 lb

## 2014-10-31 DIAGNOSIS — E119 Type 2 diabetes mellitus without complications: Secondary | ICD-10-CM | POA: Diagnosis not present

## 2014-10-31 DIAGNOSIS — I10 Essential (primary) hypertension: Secondary | ICD-10-CM

## 2014-10-31 DIAGNOSIS — E7800 Pure hypercholesterolemia, unspecified: Secondary | ICD-10-CM

## 2014-10-31 DIAGNOSIS — D649 Anemia, unspecified: Secondary | ICD-10-CM

## 2014-10-31 DIAGNOSIS — R220 Localized swelling, mass and lump, head: Secondary | ICD-10-CM

## 2014-10-31 DIAGNOSIS — R221 Localized swelling, mass and lump, neck: Secondary | ICD-10-CM

## 2014-10-31 LAB — CBC WITH DIFFERENTIAL/PLATELET
BASOS ABS: 0 10*3/uL (ref 0.0–0.1)
Basophils Relative: 0.5 % (ref 0.0–3.0)
Eosinophils Absolute: 0.2 10*3/uL (ref 0.0–0.7)
Eosinophils Relative: 3.7 % (ref 0.0–5.0)
HEMATOCRIT: 32.7 % — AB (ref 36.0–46.0)
Hemoglobin: 11.5 g/dL — ABNORMAL LOW (ref 12.0–15.0)
LYMPHS ABS: 0.9 10*3/uL (ref 0.7–4.0)
Lymphocytes Relative: 19.6 % (ref 12.0–46.0)
MCHC: 35.1 g/dL (ref 30.0–36.0)
MCV: 91.5 fl (ref 78.0–100.0)
MONO ABS: 0.4 10*3/uL (ref 0.1–1.0)
MONOS PCT: 8.8 % (ref 3.0–12.0)
NEUTROS ABS: 3.2 10*3/uL (ref 1.4–7.7)
Neutrophils Relative %: 67.4 % (ref 43.0–77.0)
PLATELETS: 157 10*3/uL (ref 150.0–400.0)
RBC: 3.58 Mil/uL — ABNORMAL LOW (ref 3.87–5.11)
RDW: 13.7 % (ref 11.5–15.5)
WBC: 4.8 10*3/uL (ref 4.0–10.5)

## 2014-10-31 LAB — LIPID PANEL
Cholesterol: 225 mg/dL — ABNORMAL HIGH (ref 0–200)
HDL: 48.3 mg/dL (ref >39.00)
NonHDL: 176.7
Total CHOL/HDL Ratio: 5
Triglycerides: 210 mg/dL — ABNORMAL HIGH (ref 0.0–149.0)
VLDL: 42 mg/dL — ABNORMAL HIGH (ref 0.0–40.0)

## 2014-10-31 LAB — COMPREHENSIVE METABOLIC PANEL
ALT: 8 U/L (ref 0–35)
AST: 15 U/L (ref 0–37)
Albumin: 3.8 g/dL (ref 3.5–5.2)
Alkaline Phosphatase: 66 U/L (ref 39–117)
BILIRUBIN TOTAL: 0.8 mg/dL (ref 0.2–1.2)
BUN: 19 mg/dL (ref 6–23)
CO2: 28 meq/L (ref 19–32)
Calcium: 9.6 mg/dL (ref 8.4–10.5)
Chloride: 104 mEq/L (ref 96–112)
Creatinine, Ser: 1.26 mg/dL — ABNORMAL HIGH (ref 0.40–1.20)
GFR: 43.19 mL/min — ABNORMAL LOW (ref >60.00)
GLUCOSE: 167 mg/dL — AB (ref 70–99)
Potassium: 4.2 mEq/L (ref 3.5–5.1)
SODIUM: 140 meq/L (ref 135–145)
Total Protein: 6.9 g/dL (ref 6.0–8.3)

## 2014-10-31 LAB — LDL CHOLESTEROL, DIRECT: LDL DIRECT: 144 mg/dL

## 2014-10-31 LAB — HEMOGLOBIN A1C: Hgb A1c MFr Bld: 5.6 % (ref 4.6–6.5)

## 2014-10-31 NOTE — Progress Notes (Signed)
Subjective:    Patient ID: Kristina Bennett, female    DOB: 03/23/1931, 81 y.o.   MRN: [id number]  HPI Here for f/u of chronic medical problems   Wt is up 4 lb  bmi is 32  Is trying to stay on track with healthy diet (prev had radiation tx)  Enjoying food more  Appetite is good   Not a lot of exercise  Mostly in the house  Goes shopping for [employer] - 2 hours at a time  Her toe bothers her if she is on feet too much  Gets outdoors occ = more upcoming   bp is stable today  No cp or palpitations or headaches or edema  No side effects to medicines  BP Readings from Last 3 Encounters:  10/31/14 138/72  05/01/14 108/56  12/20/13 124/76     DM2 Does watch sugar in diet  Lab Results  Component Value Date   HGBA1C 5.8 05/01/2014  expect it will be up a bit   Lab Results  Component Value Date   CHOL 219* 05/01/2014   HDL 39.50 05/01/2014   LDLCALC 152* 05/01/2014   LDLDIRECT 166.5 06/21/2012   TRIG 136.0 05/01/2014   CHOLHDL 6 05/01/2014  came off zetia due to aches and pains- ? Unsure if that was the cause Did not make much of a difference  Wants to re check that       Chemistry      Component Value Date/Time   NA 133* 06/20/2014 1358   K 3.8 06/20/2014 1358   CL 102 06/20/2014 1358   CO2 26 06/20/2014 1358   BUN 19 06/20/2014 1358   CREATININE 1.2 06/20/2014 1358      Component Value Date/Time   CALCIUM 9.1 06/20/2014 1358   ALKPHOS 52 10/18/2013 1443   AST 21 10/18/2013 1443   ALT 14 10/18/2013 1443   BILITOT 1.4* 10/18/2013 1443       Had f/u with oncol in March - she decided to cancel that (for removed neck mass)  Decided that was enough- she does not want more testing or treatment - is happy with how she is doing    Hx of anemia Due for a check Lab Results  Component Value Date   WBC 5.2 10/18/2013   HGB 11.5* 10/18/2013   HCT 33.5* 10/18/2013   MCV 92.9 10/18/2013   PLT 136.0* 10/18/2013    Patient Active Problem List   Diagnosis Date  Noted  . Encounter for Medicare annual wellness exam 05/01/2014  . Preop examination 12/20/2013  . Pre-ulcerative calluses 11/01/2013  . Toe ulcer due to DM 03/29/2013  . EDEMA 11/14/2009  . Disorder resulting from impaired renal function 09/24/2009  . VARICOSE VEINS, LOWER EXTREMITIES 12/05/2008  . NECK MASS 11/22/2007  . Anemia 07/05/2007  . Diabetes type 2, controlled 04/08/2007  . NEPHROPATHY, DIABETIC 04/08/2007  . DIABETIC PERIPHERAL NEUROPATHY 04/08/2007  . HYPERCHOLESTEROLEMIA 04/08/2007  . MACULAR DEGENERATION 04/08/2007  . Essential hypertension 04/08/2007  . RHEUMATOID ARTHRITIS 04/08/2007  . ADHESIVE CAPSULITIS, BILATERAL 04/08/2007  . URINARY INCONTINENCE 04/08/2007  . NEPHROLITHIASIS, HX OF 04/08/2007   Past Medical History  Diagnosis Date  . DM2 (diabetes mellitus, type 2)   . HTN (hypertension)   . Nephrolithiasis     hx  . Rheumatoid arthritis(714.0)   . Urinary incontinence   . Anemia   . Umbilical hernia   . Macular degeneration   . Renal insufficiency     poss renal vasc  dz  . Kidney congenitally absent, left     small  . Diabetes mellitus   . Rheumatoid arthritis(714.0)   . Anemia   . Macular degeneration   . Renal insufficiency    Past Surgical History  Procedure Laterality Date  . Cataract signs    . 411 echo      mild diastolic dysfunction EF 23%, slt pulm HTN    History  Substance Use Topics  . Smoking status: Never Smoker   . Smokeless tobacco: Not on file  . Alcohol Use: No   Family History  Problem Relation Age of Onset  . Heart attack Mother   . Prostate cancer Father    Allergies  Allergen Reactions  . Ace Inhibitors     REACTION: ? reaction  . Atorvastatin     REACTION: ? reaction  . Etodolac     REACTION: swelling and dizziness  . Glipizide     REACTION: low sugar  . Metformin     REACTION: inc cr  . Metoprolol Tartrate     REACTION: ? reaction  . Propoxyphene N-Acetaminophen     REACTION: ? reaction  .  Rosuvastatin     REACTION: ? reaction  . Tolterodine Tartrate     REACTION: ? reaction   Current Outpatient Prescriptions on File Prior to Visit  Medication Sig Dispense Refill  . Blood Glucose Monitoring Suppl (CONTOUR BLOOD GLUCOSE SYSTEM) W/DEVICE KIT Use to check blood sugar once daily     . gabapentin (NEURONTIN) 300 MG capsule TAKE 1 CAPSULES TWICE A DAY    . glucose blood (BAYER CONTOUR TEST) test strip 1 each by Other route daily. Use as instructed     . losartan-hydrochlorothiazide (HYZAAR) 100-12.5 MG per tablet Take 1 tablet by mouth daily. 90 tablet 3  . metFORMIN (GLUCOPHAGE) 500 MG tablet Take 1 tablet (500 mg total) by mouth 2 (two) times daily with a meal. Take 1-2 tablets by mouth twice daily 180 tablet 3  . mupirocin ointment (BACTROBAN) 2 % Apply 1 application topically 2 (two) times daily. To affected area on foot 22 g 3  . tolterodine (DETROL LA) 4 MG 24 hr capsule Take 1 capsule (4 mg total) by mouth daily. 90 capsule 3   No current facility-administered medications on file prior to visit.    Review of Systems  Review of Systems  Constitutional: Negative for fever, appetite change, fatigue and unexpected weight change.  Eyes: Negative for pain and visual disturbance.  Respiratory: Negative for cough and shortness of breath.   Cardiovascular: Negative for cp or palpitations    Gastrointestinal: Negative for nausea, diarrhea and constipation.  Genitourinary: Negative for urgency and frequency.  Skin: Negative for pallor or rash   Neurological: Negative for weakness, light-headedness, numbness and headaches.  Hematological: Negative for adenopathy. Does not bruise/bleed easily.  Psychiatric/Behavioral: Negative for dysphoric mood. The patient is not nervous/anxious.          Objective:   Physical Exam  Constitutional: She appears well-developed and well-nourished. No distress.  obese and well appearing   HENT:  Head: Normocephalic and atraumatic.    Mouth/Throat: Oropharynx is clear and moist.  Eyes: Conjunctivae and EOM are normal. Pupils are equal, round, and reactive to light.  Neck: Normal range of motion. Neck supple. No JVD present. Carotid bruit is not present. No thyromegaly present.  Cardiovascular: Normal rate, regular rhythm, normal heart sounds and intact distal pulses.  Exam reveals no gallop.   Pulmonary/Chest: Effort normal  and breath sounds normal. No respiratory distress. She has no wheezes. She has no rales.  No crackles  Abdominal: Soft. Bowel sounds are normal. She exhibits no distension, no abdominal bruit and no mass. There is no tenderness.  Large umbilical hernia-nontender and reducible   Musculoskeletal: She exhibits no edema.  Lymphadenopathy:    She has no cervical adenopathy.  Neurological: She is alert. She has normal reflexes.  Skin: Skin is warm and dry. No rash noted.  Psychiatric: She has a normal mood and affect.          Assessment & Plan:   Problem List Items Addressed This Visit      Cardiovascular and Mediastinum   Essential hypertension - Primary    bp in fair control at this time  BP Readings from Last 1 Encounters:  10/31/14 138/72   No changes needed Disc lifstyle change with low sodium diet and exercise  Lab today      Relevant Orders   CBC with Differential/Platelet (Completed)   Comprehensive metabolic panel (Completed)   Lipid panel (Completed)     Endocrine   Diabetes type 2, controlled    A1C today Diet has been more lax-pt is able to eat more / inc appetite Disc low glycemid diet  Enc exercise as tolerated       Relevant Orders   Hemoglobin A1c (Completed)     Other   Anemia    Lab today  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 Muscle pain  Re check lipids Rev low sat fat diet       NECK MASS    S/p removal and radiation   Currently desires no more monitoring or tx

## 2014-10-31 NOTE — Patient Instructions (Signed)
Try to be as active as possible -the more walking the better  Eat a healthy low fat diet (Avoid red meat/ fried foods/ egg yolks/ fatty breakfast meats/ butter, cheese and high fat dairy/ and shellfish) Also avoid too much sugar  Labs today  If hernia starts to hurt or bother you please let me know  Follow up in 6 months for annual exam with labs prior

## 2014-10-31 NOTE — Progress Notes (Signed)
Pre visit review using our clinic review tool, if applicable. No additional management support is needed unless otherwise documented below in the visit note. 

## 2014-11-01 NOTE — Assessment & Plan Note (Signed)
bp in fair control at this time  BP Readings from Last 1 Encounters:  10/31/14 138/72   No changes needed Disc lifstyle change with low sodium diet and exercise  Lab today

## 2014-11-01 NOTE — Assessment & Plan Note (Signed)
S/p removal and radiation  Currently desires no more monitoring or tx

## 2014-11-01 NOTE — Assessment & Plan Note (Signed)
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

## 2014-11-01 NOTE — Assessment & Plan Note (Signed)
A1C today Diet has been more lax-pt is able to eat more / inc appetite Disc low glycemid diet  Enc exercise as tolerated

## 2014-11-01 NOTE — Assessment & Plan Note (Signed)
Pt is off zetia due to ? Muscle pain  Re check lipids Rev low sat fat diet

## 2014-11-02 ENCOUNTER — Telehealth: Payer: Self-pay

## 2014-11-02 NOTE — Telephone Encounter (Signed)
-----   Message from Judy Pimple, MD sent at 11/01/2014  9:44 AM EDT ----- Blood count is stable Sugar control is stable  Cholesterol is not worse -do not go back on zetia (Avoid red meat/ fried foods/ egg yolks/ fatty breakfast meats/ butter, cheese and high fat dairy/ and shellfish) Kidney number is up a bit -please make sure you are drinking enough water

## 2014-11-02 NOTE — Telephone Encounter (Signed)
Left message for patient to call office regarding lab results.

## 2014-11-10 NOTE — Consult Note (Signed)
Reason for Visit: This 81 year old Female patient presents to the clinic for initial evaluation of  submandibular carcinoma .   Referred by Dr. Willeen Cass.  Diagnosis:  Chief Complaint/Diagnosis   81 year old female status post wide local excision of a right submandibular gland the mass biopsy-positive for myoepithelial carcinoma high grade  Pathology Report pathology report reviewed   Imaging Report CT scan reviewed   Referral Report clinical notes reviewed   Planned Treatment Regimen PET CT scan and possible adjuvant radiation therapy   HPI   patient is a 81 year old female with a history of long standing and progressing right submandibular gland the mass. Previous biopsies have been benign although tumor continue to grow and patient requested its removal. Initial CT scan showed large right cemented in the mass either arising from or displacing the right submandibular gland concerning for neoplasm. Tumor was approximately 9 x 5.6 x 6.3 cm.patient had resection and surgical pathology report was positive for myoepithelial carcinoma. Based on extracapsular invasion atypia necrosis a high grade designation was made. Patient has recovered well postoperatively. She seen today for consideration of adjuvant radiation therapy. She's having no dysphasia head and neck pain or alteration in taste at this time.  Past Hx:    Hypercholesterolemia:    Diabetes:    HTN:    submandibular mass:   Past, Family and Social History:  Past Medical History positive   Cardiovascular hyperlipidemia; hypertension   Endocrine diabetes mellitus   Past Medical History Comments history of arthritis and anemia.   Family History noncontributory   Social History noncontributory   Additional Past Medical and Surgical History accompaniedby 2 daughters today   Allergies:   Etodolac: Dizzy/Fainting, Swelling  Glipizide: Other  Metformin: Other  Metoprolol: Unknown  Propoxyphene: Unknown  rosuvastatin:  Unknown  Tolterodine Tartrate: Unknown  Ace Inhibitors: Unknown  atorvastatin: Unknown  Home Meds:  Home Medications: Medication Instructions Status  ALPRAZolam 1 mg oral tablet 1 tab(s) orally once, take 1 hour prior to PET Scan Active  metFORMIN 500 mg oral tablet 1 tab(s) orally 2 times a day Active  hydrochlorothiazide-losartan 12.5 mg-100 mg oral tablet 1 tab(s) orally once a day Active  Zetia 10 mg oral tablet 1 tab(s) orally once a day Active  gabapentin 300 mg oral capsule 1 cap(s) orally once a day (at bedtime) Active  Detrol LA 4 mg oral capsule, extended release 1 cap(s) orally once a day Active  Vitamin D3 2000 intl units oral capsule 1 cap(s) orally once a day Active  folic acid 0.8 mg oral tablet 1 tab(s) orally once a day Active  iron 1 tab(s) orally once a day 65 mg Active  Vitamin B-100 Vitamin B Complex with C oral tablet, extended release 1 tab(s) orally once a day Active   Review of Systems:  General negative   Performance Status (ECOG) 0   Skin negative   Breast negative   Ophthalmologic negative   ENMT see HPI   Respiratory and Thorax negative   Cardiovascular negative   Gastrointestinal negative   Genitourinary negative   Musculoskeletal negative   Neurological negative   Psychiatric negative   Hematology/Lymphatics negative   Endocrine negative   Allergic/Immunologic negative   Review of Systems   review of systems obtained from nurses notes  Nursing Notes:  Nursing Vital Signs and Chemo Nursing Nursing Notes: *CC Vital Signs Flowsheet:   13-Jul-15 14:19  Temp Temperature 97.5  Pulse Pulse 93  Respirations Respirations 20  SBP SBP 164  DBP DBP 80  Pain Scale (0-10)  0  Current Weight (kg) (kg) 81.8   Physical Exam:  General/Skin/HEENT:  General normal   Skin normal   Eyes normal   Additional PE well-developed elderly female in a wheelchair in NAD. Oral cavity is clear teeth are in a good state of repair no oral  mucosal lesions are identified. Indirect mirror examination shows upper awake clear vallecula and base of tongue within normal limits. she is status post right neck dissection with incision healing well. No evidence of submandibular cervical or supraclavicular adenopathy is appreciated. Lungs are clear to A&P cardiac examination shows regular rate and rhythm. Abdomen is obese with no organomegaly or masses noted.   Breasts/Resp/CV/GI/GU:  Respiratory and Thorax normal   Cardiovascular normal   Gastrointestinal normal   Genitourinary normal   MS/Neuro/Psych/Lymph:  Musculoskeletal normal   Neurological normal   Lymphatics normal   Other Results:  Radiology Results: LabUnknown:    26-May-15 10:59, CT Neck With Contrast  PACS Image   CT:  CT Neck With Contrast   REASON FOR EXAM:    neck mass  pt takes Metformin  COMMENTS:       PROCEDURE: KCT - KCT NECK WITH CONTRAST  - Dec 12 2013 10:59AM     CLINICAL DATA:  Anterior right neck mass enlarging for years.    EXAM:  CT NECK WITH CONTRAST    TECHNIQUE:  Multidetector CT imaging of the neck was performed using the  standard protocol following the bolus administration of intravenous  contrast.  CONTRAST:  60 mL Isovue-300    COMPARISON:  None.    FINDINGS:  The visualized portion of the brain is unremarkable.There is trace  right maxillary sinus mucosal thickening. Visualized mastoid air  cells are clear.    There is a large right submandibular region mass measuring  approximately 9.1 x 5.6 x 6.3 cm. The mass demonstrates  heterogeneous internal density with areas of fluid/ necrosis as well  as coarse central calcification. The right submandibular gland is  not well seen and not clearly identified separate from this mass.  Left submandibular and bilateral parotid glands are unremarkable.  No enlargedlymph nodes are identified elsewhere in the neck. The  thyroid gland is unremarkable. Visualized lung apices are  grossly  clear. Moderate atherosclerotic aortic arch calcification is  present. There is also moderate calcification at the carotid  bifurcations, right greater than left. There is a retropharyngeal  course of the proximal right internal carotid artery. There is grade  1 anterolisthesis of is C5 on C6. Advanced multilevel facet  arthrosis is present in the cervical spine. Advanced multilevel  degenerative disc disease is present in the lower cervical and the  visualized upper thoracic spine.     IMPRESSION:  Large right submandibular mass either arising from or displacing the  right submandibular gland and concerning for neoplasm. Tissue  sampling recommended.  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 I have ordered a PET CT scan concentrating head and neck area to evaluate any possibility of residual disease or lymph node metastasis. Once those results are obtained we'll see the patient and her family again and start treatment planning. Patient and her daughters all seem to comprehend my treatment plan well.I will also be presenting her case at our multidisciplinary in tumor conference.  I would like to take this opportunity for allowing me to participate in the care of your patient..   CC Referral:  cc: Dr. Willeen Cass, Dr. Idamae Schuller. Tower   Electronic Signatures: Matthewjames Petrasek, Gordy Councilman (MD)  (Signed 13-Jul-15 15:38)  Authored: HPI, Diagnosis, Past  Hx, PFSH, Allergies, Home Meds, ROS, Nursing Notes, Physical Exam, Other Results, Relevent Results, Encounter Assessment and Plan, CC Referring Physician   Last Updated: 13-Jul-15 15:38 by Rebeca Alert (MD)

## 2014-11-10 NOTE — Op Note (Signed)
PATIENT NAME:  RIDA, LOUDIN MR#:  [id number] DATE OF BIRTH:  06-08-1931  DATE OF PROCEDURE:  01/10/2014  PREOPERATIVE DIAGNOSIS: Right submandibular gland mass.   POSTOPERATIVE DIAGNOSIS:  Right submandibular gland mass.  PROCEDURE: Excision of right submandibular gland with associated mass.   SURGEON: Marion Downer, M.D  FIRST ASSISTANT: Vernie Murders, M.D   ANESTHESIA: General endotracheal.   INDICATIONS: Patient with a large right submandibular gland mass present for a number of years which has increased in size, suspicious for a pleomorphic adenoma.   FINDINGS: This is a 9.1 x 5.6 x 6.3 right submandibular gland mass with a fairly well defined capsule arising from the inferolateral aspect of the submandibular gland.   COMPLICATIONS: None.   DESCRIPTION OF PROCEDURE: After obtaining informed consent, the patient was taken to the Operating Room and placed in supine position. After induction of general endotracheal anesthesia, the patient was turned 90 degrees. Facial nerve monitor electrodes were placed on the  upper and lower lip and proper functioning confirmed. The right neck was then injected over the mass with 1% lidocaine with epinephrine 1:200,000.  The right neck was then prepped and draped in the usual sterile fashion.  A 15 blade was used to incise the skin down along a skin crease and over the mass, excising an ellipse of overlying skin to correct for predicted skin redundancy given the large size of the mass. Incision was carried down through the subcutaneous tissues and through the platysma. The platysma was then divided with the Harmonic scalpel, taking care to watch for any facial nerve stimulation. The flap was then elevated over the mass in  the subplatysmal plane. The marginal mandibular nerve was identified and was stretched out slightly over the upper third of the mass. It was identified visually, as well as confirming with the nerve stimulator. The platysmal flap was  dissected away from the mass with the marginal nerve carefully protected staying right on the capsule of the mass itself. Dissection continued with the Harmonic scalpel until the flap was elevated and the edge of the mandible palpable. The soft tissue attachments were then divided the flap to the mass along the superior border, and the mass retracted inferiorly. The mylohyoid muscle was fairly easily identified and the mass dissected away from it easily. The submandibular gland itself was identified deep to the mass. Veins communicating into the gland were divided with the Harmonic scalpel and arterial branches from the facial artery were divided as they entered the gland, preserving the facial artery during dissection. The nerve branch from the lingual nerve to the gland was identified and divided with the Harmonic scalpel. The gland was carefully dissecting out dividing the submandibular duct and subsequently dissecting the inferiorly. The gland and associated mass were then dissected off of the digastric muscle and inferior attachments of the gland divided. One larger vein was clamped and divided and suture-ligated with silk suture. Mass was completely dissected away from the neck and delivered. The wound was irrigated and minor bleeding controlled with the bipolar cautery. The excess skin that had been outlined with an ellipse was then removed and the wound closed after placement of a #10 JP drain through a separate small incision in the skin. A 4-0 Vicryl was used for the closure of the platysma and subcutaneous tissues followed by a 5-0 Prolene in a running lock stitch to close the skin. The drain was secured to the skin with a 2-0 silk suture and the drain hooked up to  bulb suction. The patient was then returned to the anesthesiologist for awakening. She was awakened and taken to the recovery room in good condition postoperatively. Blood loss was less than 25 mL.     ____________________________ Ollen Gross.  Willeen Cass, MD psb:ts D: 01/10/2014 10:04:03 ET T: 01/10/2014 14:20:41 ET JOB#: [id number]  cc: Ollen Gross. Willeen Cass, MD, <Dictator> Sandi Mealy MD ELECTRONICALLY SIGNED 01/26/2014 12:47

## 2015-01-07 ENCOUNTER — Other Ambulatory Visit: Payer: Self-pay | Admitting: Family Medicine

## 2015-01-14 ENCOUNTER — Other Ambulatory Visit: Payer: Self-pay

## 2015-02-21 ENCOUNTER — Ambulatory Visit (INDEPENDENT_AMBULATORY_CARE_PROVIDER_SITE_OTHER): Payer: Medicare Other | Admitting: Podiatry

## 2015-02-21 ENCOUNTER — Encounter: Payer: Self-pay | Admitting: Podiatry

## 2015-02-21 VITALS — BP 127/84 | HR 79 | Resp 16

## 2015-02-21 DIAGNOSIS — M2040 Other hammer toe(s) (acquired), unspecified foot: Secondary | ICD-10-CM

## 2015-02-21 DIAGNOSIS — E1140 Type 2 diabetes mellitus with diabetic neuropathy, unspecified: Secondary | ICD-10-CM | POA: Diagnosis not present

## 2015-02-21 DIAGNOSIS — I739 Peripheral vascular disease, unspecified: Secondary | ICD-10-CM

## 2015-02-21 DIAGNOSIS — E1149 Type 2 diabetes mellitus with other diabetic neurological complication: Secondary | ICD-10-CM

## 2015-02-21 DIAGNOSIS — Z87898 Personal history of other specified conditions: Secondary | ICD-10-CM

## 2015-02-21 MED ORDER — CEPHALEXIN 500 MG PO CAPS: 500.0000 mg | ORAL_CAPSULE | Freq: Three times a day (TID) | ORAL | Status: DC

## 2015-02-21 NOTE — Patient Instructions (Signed)
Continue daily dressing changes daily with antibiotic ointment and a bandage daily.  Stop using corn removal ointments  Monitor for any signs/symptoms of infection. Call the office immediately if any occur or go directly to the emergency room. Call with any questions/concerns.

## 2015-02-23 NOTE — Progress Notes (Signed)
Subjective:     Patient ID: Kristina Bennett, female   DOB: 1931/01/02, 81 y.o.   MRN: [id number]  HPI 81 year old female presents the office today with her son for concerns of ulcerations to both of her feet on the toes. She states that she previously has seen other podiatrist although she has not seen a doctor in several years for this. She states that due to her hammertoes she continues to get calluses which turned into wounds of the end of the toes. She has not been applying any ointments or medications to the wounds at this time. She does have diabetic shoes that she wears. The patient's son states that he has been applying corn remover pads to the corn particularly on the left fourth toe. Over the last couple days he has noticed some redness of the toe. Denies any drainage or purulence. No other complaints at this time.   Review of Systems  All other systems reviewed and are negative.      Objective:   Physical Exam AAO x3, NAD  DP/PT pulses decreased 1/4 bilaterally, CRT less than 3 seconds Protective sensation decreased with Simms Weinstein monofilament, decreased vibratory sensation, Achilles tendon reflex intact. There is hammertoe contractures present bilaterally. There is superficial ulcerations present the distal aspect the left fourth toe and the right third toe. Hyperkeratotic periwound. Upon debridement the wounds appear to be superficial nerves no probing, undermining, tunneling. On the left fourth digit there is erythema to the digit from the distal aspect level of the PIPJ. Erythema appears to be mostly dorsal aspect of the digit with trace edema. There is no areas of fluctuance or crepitus. There is no ascending cellulitis. There is no drainage or purulence from around the wound. There is no signs of infection around the right third toe. No other open lesions or pre-ulcerative lesions identified bilaterally. No areas of tenderness to bilateral lower extremity is. No other areas of  edema, erythema, increased warmth. There is no pain with calf compression, swelling, warmth, erythema.     Assessment:     81 year old female with superficial ulcerations due to underlying hammertoe with localized erythema left fourth toe.     Plan:     -Treatment options discussed including all alternatives, risks, and complications -Wounds are sharply debrided without complications to healthy, bleeding, granular wound base. Recommended to continue antibiotic ointment dressing changes daily. Due to the erythema on the left fourth toe prescribed Keflex. Recommended to stop using corn remover ointment. Monitor for any clinical signs or symptoms of infection and directed to call the office immediately should any occur or go to the ER. -Dispensed toe crest to help offload the toes. -Rx diabetic shoes  -Follow-up 2 weeks or sooner if any problems arise. In the meantime, encouraged to call the office with any questions, concerns, change in symptoms.   Ovid Curd, DPM

## 2015-03-07 ENCOUNTER — Encounter: Payer: Self-pay | Admitting: Podiatry

## 2015-03-07 ENCOUNTER — Ambulatory Visit (INDEPENDENT_AMBULATORY_CARE_PROVIDER_SITE_OTHER): Payer: Medicare Other | Admitting: Podiatry

## 2015-03-07 VITALS — BP 152/80 | HR 76 | Resp 18

## 2015-03-07 DIAGNOSIS — E1140 Type 2 diabetes mellitus with diabetic neuropathy, unspecified: Secondary | ICD-10-CM

## 2015-03-07 DIAGNOSIS — E1149 Type 2 diabetes mellitus with other diabetic neurological complication: Secondary | ICD-10-CM

## 2015-03-07 DIAGNOSIS — L97511 Non-pressure chronic ulcer of other part of right foot limited to breakdown of skin: Secondary | ICD-10-CM

## 2015-03-07 DIAGNOSIS — M2040 Other hammer toe(s) (acquired), unspecified foot: Secondary | ICD-10-CM | POA: Diagnosis not present

## 2015-03-07 DIAGNOSIS — L84 Corns and callosities: Secondary | ICD-10-CM | POA: Diagnosis not present

## 2015-03-07 DIAGNOSIS — I739 Peripheral vascular disease, unspecified: Secondary | ICD-10-CM | POA: Diagnosis not present

## 2015-03-07 NOTE — Patient Instructions (Signed)
Monitor for any clinical signs or symptoms of infection and directed to call the office immediately should any occur or go to the ER.  

## 2015-03-08 NOTE — Progress Notes (Signed)
Patient ID: Kristina Bennett, female   DOB: 05/13/1931, 81 y.o.   MRN: [id number]  Subjective: 81 year old female presents after the daughter for follow-up evaluation of bilateral fourth digit ulcerations. She states that she can use were offloading pad was seems to help. She thinks that the wound on the left side is doing well she does have pain to the right fourth toe. She denies any swelling redness or any drainage. She does start to develop a rash to the top of her right foot which she noticed this morning. She states it is not itching her. Denies any other opened sores. No other complaints at this time. She denies any systemic complaints as fevers, chills, nausea, vomiting. No calf pain, chest pain, stress of breath.  Objective: AAO 3, NAD DP/PT pulses palpable was/4 bilaterally Protective sensation decreased with Sims Weinstein monofilament At the distal aspect left fourth toe there is hyperkeratotic lesion. Upon debridement underlying ulceration appears to be healed. There is no edema, erythema, increase in warmth. At the distal aspect of the right fourth toe there is a hyperkeratotic lesion associated ulceration. Upon debridement the wound measures about 0.3 x 0.37 m of granular wound base. There is no probing, undermining, tunneling. There is faint erythema around the toe distally without any ascending saline disc. There is no areas of fluctuance or crepitus. There is an erythematous rash the dorsal aspect of the right forefoot almost appears to be a small area of petechiae however subjectively the area does not itch and there is no swelling.  Hammertoe contractures bilaterally.  No other open lesions or pre-ulcerative lesions No pain with calf compression, swelling, warmth, erythema.   Assessment: 81 year old female pre-ulcerative lesion left fourth and ulceration right foot fourth toe with slight erythema; right foot rash  Plan: -Treatment options discussed including all alternatives,  risks, and complications -Left fourth toe lesion sharply debrided without complication/bleeding. No underlying ulceration -Right fourth toe wound sharply debrided to healthy, bleeding, granular wound base. Continue with daily dressing changes with antibiotic ointment and a bandage. Continue antibiotics to the faint erythema for 1 week. They have a refill. Offloading pads dispensed. -Ice cortisone cream for right foot rash. Discontinue the new offloading pad with silicone that she purchased at  the drugstore; she may be having a reaction to this. Monitor closely for any worsening or any change. -Follow-up 2-3 weeks or sooner if any problems arise. In the meantime, encouraged to call the office with any questions, concerns, change in symptoms.   Ovid Curd, DPM

## 2015-03-28 ENCOUNTER — Ambulatory Visit (INDEPENDENT_AMBULATORY_CARE_PROVIDER_SITE_OTHER): Payer: Medicare Other | Admitting: Podiatry

## 2015-03-28 ENCOUNTER — Encounter: Payer: Self-pay | Admitting: Podiatry

## 2015-03-28 DIAGNOSIS — E1140 Type 2 diabetes mellitus with diabetic neuropathy, unspecified: Secondary | ICD-10-CM

## 2015-03-28 DIAGNOSIS — L84 Corns and callosities: Secondary | ICD-10-CM | POA: Diagnosis not present

## 2015-03-28 DIAGNOSIS — E1149 Type 2 diabetes mellitus with other diabetic neurological complication: Secondary | ICD-10-CM

## 2015-03-28 DIAGNOSIS — L97511 Non-pressure chronic ulcer of other part of right foot limited to breakdown of skin: Secondary | ICD-10-CM | POA: Diagnosis not present

## 2015-03-28 MED ORDER — SILVER SULFADIAZINE 1 % EX CREA: 1.0000 "application " | TOPICAL_CREAM | Freq: Every day | CUTANEOUS | Status: DC

## 2015-03-28 NOTE — Progress Notes (Signed)
Patient ID: Kristina Bennett, female   DOB: 1931-01-15, 81 y.o.   MRN: [id number]  Subjective: 81 year old female presents the office today with  Her daughter for follow valuation of bilateral toe pain. She states that she continues have pain to the right third toe. She has continued toe crest however does not seem to alleviate her symptoms. She has tried shoe gear changes although she can do the pain an ulceration of the toe. Most of her pain is a weightbearing. She denies any drainage or redness from around the toe. Denies any red streaks. Left foot wound on the toe has continued to stay close. She denies any systemic complaints as fevers, chills, nausea, vomiting.  Objective: AAO 3, NAD DP/PT pulses palpable, CRT less than 3 seconds Protective sensation intact with Simms Weinstein monofilament Hammertoe contractures are present bilaterally. On the distal plantar aspect of the right third digit there is a annular hyperkeratotic lesion with a central ulceration. Upon debridement there not ulceration has a granular wound base and is superficial. There is no probing, undermining, tunneling. Measures about  0.4 x 0.4 cm. Is no surrounding erythema, ascending  cellulitis, edema, drainage/purulence, malodor. Previous ulceration left fourth toe is healed and remains close. No other open lesions or pre-ulcer lesions are identified bilaterally. No pain with calf compression, swelling, warmth, erythema.  Assessment: 81 year old  Female continue ulceration right third toe  Plan: -Treatment options discussed including all alternatives, risks, and complications -Lesion sharply debrided without complications. And was debrided to healthy, bleeding, granular wound base. Prior to this topical lidocaine was applied. Continue with Silvadene dressing changes. Silvadene was prescribed today. -Continue offloading pads. Various offloading pads were made in dispensed to the patient. -She is concerned about slow  healing of the wound. I discussed to have vascular studies performed however they wish to hold off on this time. -Monitor for any clinical signs or symptoms of infection and directed to call the office immediately should any occur or go to the ER. -Follow-up in 3 weeks or sooner if any problems arise. In the meantime, encouraged to call the office with any questions, concerns, change in symptoms.   Ovid Curd, DPM

## 2015-04-18 ENCOUNTER — Ambulatory Visit: Payer: Medicare Other | Admitting: Podiatry

## 2015-04-29 ENCOUNTER — Telehealth: Payer: Self-pay | Admitting: Family Medicine

## 2015-04-29 DIAGNOSIS — E1122 Type 2 diabetes mellitus with diabetic chronic kidney disease: Secondary | ICD-10-CM

## 2015-04-29 DIAGNOSIS — E7800 Pure hypercholesterolemia, unspecified: Secondary | ICD-10-CM

## 2015-04-29 DIAGNOSIS — I10 Essential (primary) hypertension: Secondary | ICD-10-CM

## 2015-04-29 NOTE — Telephone Encounter (Signed)
-----   Message from Alvina Chou sent at 04/24/2015  5:33 PM EDT ----- Regarding: Lab orders for Tuesday, 10.11.16 Patient is scheduled for CPX labs, please order future labs, Thanks , Camelia Eng

## 2015-04-30 ENCOUNTER — Other Ambulatory Visit (INDEPENDENT_AMBULATORY_CARE_PROVIDER_SITE_OTHER): Payer: Medicare Other

## 2015-04-30 DIAGNOSIS — E7800 Pure hypercholesterolemia, unspecified: Secondary | ICD-10-CM | POA: Diagnosis not present

## 2015-04-30 DIAGNOSIS — I10 Essential (primary) hypertension: Secondary | ICD-10-CM | POA: Diagnosis not present

## 2015-04-30 DIAGNOSIS — E1122 Type 2 diabetes mellitus with diabetic chronic kidney disease: Secondary | ICD-10-CM | POA: Diagnosis not present

## 2015-04-30 LAB — CBC WITH DIFFERENTIAL/PLATELET
Basophils Absolute: 0 10*3/uL (ref 0.0–0.1)
Basophils Relative: 0.8 % (ref 0.0–3.0)
EOS ABS: 0.1 10*3/uL (ref 0.0–0.7)
EOS PCT: 3.2 % (ref 0.0–5.0)
HCT: 36.3 % (ref 36.0–46.0)
HEMOGLOBIN: 12.1 g/dL (ref 12.0–15.0)
LYMPHS ABS: 1 10*3/uL (ref 0.7–4.0)
Lymphocytes Relative: 22.5 % (ref 12.0–46.0)
MCHC: 33.4 g/dL (ref 30.0–36.0)
MCV: 93.7 fl (ref 78.0–100.0)
MONO ABS: 0.4 10*3/uL (ref 0.1–1.0)
Monocytes Relative: 9.1 % (ref 3.0–12.0)
NEUTROS ABS: 2.8 10*3/uL (ref 1.4–7.7)
Neutrophils Relative %: 64.4 % (ref 43.0–77.0)
PLATELETS: 152 10*3/uL (ref 150.0–400.0)
RBC: 3.88 Mil/uL (ref 3.87–5.11)
RDW: 14 % (ref 11.5–15.5)
WBC: 4.3 10*3/uL (ref 4.0–10.5)

## 2015-04-30 LAB — HEMOGLOBIN A1C: HEMOGLOBIN A1C: 5.5 % (ref 4.6–6.5)

## 2015-04-30 LAB — TSH: TSH: 0.73 u[IU]/mL (ref 0.35–4.50)

## 2015-05-01 LAB — LIPID PANEL
Cholesterol: 262 mg/dL — ABNORMAL HIGH (ref 0–200)
HDL: 68.5 mg/dL (ref >39.00)
LDL Cholesterol: 178 mg/dL — ABNORMAL HIGH (ref 0–99)
NONHDL: 193.02
TRIGLYCERIDES: 75 mg/dL (ref 0.0–149.0)
Total CHOL/HDL Ratio: 4
VLDL: 15 mg/dL (ref 0.0–40.0)

## 2015-05-01 LAB — COMPREHENSIVE METABOLIC PANEL
ALBUMIN: 4.2 g/dL (ref 3.5–5.2)
ALK PHOS: 64 U/L (ref 39–117)
ALT: 8 U/L (ref 0–35)
AST: 15 U/L (ref 0–37)
BUN: 17 mg/dL (ref 6–23)
CHLORIDE: 103 meq/L (ref 96–112)
CO2: 29 mEq/L (ref 19–32)
Calcium: 10.2 mg/dL (ref 8.4–10.5)
Creatinine, Ser: 1.17 mg/dL (ref 0.40–1.20)
GFR: 46.99 mL/min — AB (ref >60.00)
GLUCOSE: 123 mg/dL — AB (ref 70–99)
POTASSIUM: 4.6 meq/L (ref 3.5–5.1)
SODIUM: 139 meq/L (ref 135–145)
TOTAL PROTEIN: 7.6 g/dL (ref 6.0–8.3)
Total Bilirubin: 1.1 mg/dL (ref 0.2–1.2)

## 2015-05-07 ENCOUNTER — Encounter: Payer: Self-pay | Admitting: Family Medicine

## 2015-05-07 ENCOUNTER — Ambulatory Visit (INDEPENDENT_AMBULATORY_CARE_PROVIDER_SITE_OTHER): Payer: Medicare Other | Admitting: Family Medicine

## 2015-05-07 VITALS — BP 130/60 | HR 93 | Temp 97.7°F | Ht 60.5 in | Wt 171.2 lb

## 2015-05-07 DIAGNOSIS — Z0000 Encounter for general adult medical examination without abnormal findings: Secondary | ICD-10-CM | POA: Diagnosis not present

## 2015-05-07 DIAGNOSIS — I10 Essential (primary) hypertension: Secondary | ICD-10-CM

## 2015-05-07 DIAGNOSIS — E7800 Pure hypercholesterolemia, unspecified: Secondary | ICD-10-CM

## 2015-05-07 DIAGNOSIS — E1122 Type 2 diabetes mellitus with diabetic chronic kidney disease: Secondary | ICD-10-CM | POA: Diagnosis not present

## 2015-05-07 DIAGNOSIS — Z23 Encounter for immunization: Secondary | ICD-10-CM | POA: Diagnosis not present

## 2015-05-07 MED ORDER — METFORMIN HCL 500 MG PO TABS: 500.0000 mg | ORAL_TABLET | Freq: Two times a day (BID) | ORAL | Status: DC

## 2015-05-07 MED ORDER — LOSARTAN POTASSIUM-HCTZ 100-12.5 MG PO TABS: 1.0000 | ORAL_TABLET | Freq: Every day | ORAL | Status: DC

## 2015-05-07 NOTE — Patient Instructions (Signed)
Blood pressure is better on 2nd check  Try to get 2000 iu vitamin D daily  Flu shot today  Get established with a eye doctor  Labs are stable  Continue follow up with foot doctor for foot ulcers   F/u up with me in 6 months with labs prior

## 2015-05-07 NOTE — Progress Notes (Signed)
Pre visit review using our clinic review tool, if applicable. No additional management support is needed unless otherwise documented below in the visit note. 

## 2015-05-07 NOTE — Progress Notes (Signed)
Subjective:    Patient ID: Kristina Bennett, female    DOB: 1931/06/21, 81 y.o.   MRN: [id number]  HPI  Here for annual medicare wellness visit as well as chronic/acute medical problems as well as annual preventative exam  I have personally reviewed the Medicare Annual Wellness questionnaire and have noted 1. The patient's medical and social history 2. Their use of alcohol, tobacco or illicit drugs 3. Their current medications and supplements 4. The patient's functional ability including ADL's, fall risks, home safety risks and hearing or visual             impairment. 5. Diet and physical activities 6. Evidence for depression or mood disorders  The patients weight, height, BMI have been recorded in the chart and visual acuity is per eye clinic.  I have made referrals, counseling and provided education to the patient based review of the above and I have provided the pt with a written personalized care plan for preventive services. Reviewed and updated provider list, see scanned forms.  Doing ok overall   Had a pre ulcerative callous - seeing podiatry now developed another wound - working on this  May need ABIs or NCV  She is very active and stays on her feet    See scanned forms.  Routine anticipatory guidance given to patient.  See health maintenance. Colon cancer screening-no colonoscopy at her age , declines stool kit  Breast cancer screening- declines mammogram or breast cancer screening  Self breast exam -no lumps  Flu -vaccine today  Tetanus vaccine 11/12 up to date  Pneumovax- complete on both 10/15  Zoster vaccine- declines/not covered  dexa - not interested , no falls/ no fractures/ very careful , does not take ca or D (took px D for a while and then otc) Advance directive- has a living will and POA  Cognitive function addressed- see scanned forms- and if abnormal then additional documentation follows. -  No concerns /is pretty sharp , occ forgets a word only if she is  very tired (knows what to watch for) Reads and stays up on politics   PMH and SH reviewed  Meds, vitals, and allergies reviewed.   ROS: See HPI.  Otherwise negative.    Wt is stable/obese range   bp is up on first check today -improved after sitting BP: 130/60 mmHg   No cp or palpitations or headaches or edema  No side effects to medicines  BP Readings from Last 3 Encounters:  05/07/15 152/78  03/07/15 152/80  02/21/15 127/84     DM2 Lab Results  Component Value Date   HGBA1C 5.5 04/30/2015   Very well controlled  Down from 5.6  Currently on metformin 500 mg  opth was 4/15 - is interested in changing her eye doctor (has macular degeneration and is frustrated) -no diabetic damage     Renal insuf Lab Results  Component Value Date   CREATININE 1.17 04/30/2015   GFR is 46-stable Multifactorial  Hyperlipidemia Lab Results  Component Value Date   CHOL 262* 04/30/2015   CHOL 225* 10/31/2014   CHOL 219* 05/01/2014   Lab Results  Component Value Date   HDL 68.50 04/30/2015   HDL 48.30 10/31/2014   HDL 39.50 05/01/2014   Lab Results  Component Value Date   LDLCALC 178* 04/30/2015   LDLCALC 152* 05/01/2014   LDLCALC 125* 10/18/2013   Lab Results  Component Value Date   TRIG 75.0 04/30/2015   TRIG 210.0* 10/31/2014  TRIG 136.0 05/01/2014   Lab Results  Component Value Date   CHOLHDL 4 04/30/2015   CHOLHDL 5 10/31/2014   CHOLHDL 6 05/01/2014   Lab Results  Component Value Date   LDLDIRECT 144.0 10/31/2014   LDLDIRECT 166.5 06/21/2012   LDLDIRECT 141.0 11/18/2011    Cannot tolerate statin or zetia  Is high - despite good diet choices    Takes gabapentin for her neuropathy - she cannot tolerated more than 300 Avoid nsaid due to kidney function  Tylenol does not help   Patient Active Problem List   Diagnosis Date Noted  . Routine general medical examination at a health care facility 05/07/2015  . Encounter for Medicare annual wellness exam  05/01/2014  . Preop examination 12/20/2013  . Pre-ulcerative calluses 11/01/2013  . Toe ulcer due to DM ([place]) 03/29/2013  . EDEMA 11/14/2009  . Disorder resulting from impaired renal function 09/24/2009  . VARICOSE VEINS, LOWER EXTREMITIES 12/05/2008  . NECK MASS 11/22/2007  . Anemia 07/05/2007  . Diabetes type 2, controlled ([place]) 04/08/2007  . NEPHROPATHY, DIABETIC 04/08/2007  . DIABETIC PERIPHERAL NEUROPATHY 04/08/2007  . HYPERCHOLESTEROLEMIA 04/08/2007  . MACULAR DEGENERATION 04/08/2007  . Essential hypertension 04/08/2007  . RHEUMATOID ARTHRITIS 04/08/2007  . ADHESIVE CAPSULITIS, BILATERAL 04/08/2007  . URINARY INCONTINENCE 04/08/2007  . NEPHROLITHIASIS, HX OF 04/08/2007   Past Medical History  Diagnosis Date  . DM2 (diabetes mellitus, type 2) ([place])   . HTN (hypertension)   . Nephrolithiasis     hx  . Rheumatoid arthritis(714.0)   . Urinary incontinence   . Anemia   . Umbilical hernia   . Macular degeneration   . Renal insufficiency     poss renal vasc dz  . Kidney congenitally absent, left     small  . Diabetes mellitus   . Rheumatoid arthritis(714.0)   . Anemia   . Macular degeneration   . Renal insufficiency    Past Surgical History  Procedure Laterality Date  . Cataract signs    . 411 echo      mild diastolic dysfunction EF 81%, slt pulm HTN    Social History  Substance Use Topics  . Smoking status: Never Smoker   . Smokeless tobacco: None  . Alcohol Use: No   Family History  Problem Relation Age of Onset  . Heart attack Mother   . Prostate cancer Father    Allergies  Allergen Reactions  . Ace Inhibitors     REACTION: ? reaction  . Atorvastatin     REACTION: ? reaction  . Etodolac     REACTION: swelling and dizziness  . Glipizide     REACTION: low sugar  . Metformin     REACTION: inc cr  . Metoprolol Tartrate     REACTION: ? reaction  . Propoxyphene N-Acetaminophen     REACTION: ? reaction  . Rosuvastatin     REACTION: ? reaction  .  Tolterodine Tartrate     REACTION: ? reaction   Current Outpatient Prescriptions on File Prior to Visit  Medication Sig Dispense Refill  . Blood Glucose Monitoring Suppl (CONTOUR BLOOD GLUCOSE SYSTEM) W/DEVICE KIT Use to check blood sugar once daily     . cephALEXin (KEFLEX) 500 MG capsule Take 1 capsule (500 mg total) by mouth 3 (three) times daily. 30 capsule 2  . gabapentin (NEURONTIN) 300 MG capsule TAKE 1 CAPSULES TWICE A DAY    . glucose blood (BAYER CONTOUR TEST) test strip 1 each by Other route daily. Use  as instructed     . mupirocin ointment (BACTROBAN) 2 % Apply 1 application topically 2 (two) times daily. To affected area on foot 22 g 3  . silver sulfADIAZINE (SILVADENE) 1 % cream Apply 1 application topically daily. 50 g 0  . tolterodine (DETROL LA) 4 MG 24 hr capsule Take 1 capsule (4 mg total) by mouth daily. 90 capsule 3   No current facility-administered medications on file prior to visit.     Review of Systems Review of Systems  Constitutional: Negative for fever, appetite change, fatigue and unexpected weight change.  Eyes: Negative for pain and visual disturbance.  Respiratory: Negative for cough and shortness of breath.   Cardiovascular: Negative for cp or palpitations    Gastrointestinal: Negative for nausea, diarrhea and constipation.  Genitourinary: Negative for urgency and frequency.  Skin: Negative for pallor or rash  pos for wounds on toes  Neurological: Negative for weakness, light-headedness,  and headaches. pos for periph neuropathy Hematological: Negative for adenopathy. Does not bruise/bleed easily.  Psychiatric/Behavioral: Negative for dysphoric mood. The patient is not nervous/anxious.         Objective:   Physical Exam  Constitutional: She appears well-developed and well-nourished. No distress.  obese and well appearing   HENT:  Head: Normocephalic and atraumatic.  Right Ear: External ear normal.  Left Ear: External ear normal.  Nose: Nose  normal.  Mouth/Throat: Oropharynx is clear and moist.  Neck scars present   Eyes: Conjunctivae and EOM are normal. Pupils are equal, round, and reactive to light. Right eye exhibits no discharge. Left eye exhibits no discharge. No scleral icterus.  Neck: Normal range of motion. Neck supple. No JVD present. Carotid bruit is not present. No thyromegaly present.  Cardiovascular: Normal rate, regular rhythm, normal heart sounds and intact distal pulses.  Exam reveals no gallop.   Pulmonary/Chest: Effort normal and breath sounds normal. No respiratory distress. She has no wheezes. She has no rales.  Abdominal: Soft. Bowel sounds are normal. She exhibits no distension and no mass. There is no tenderness.  Stable umbilical hernia that is reducible  Genitourinary:  Declines breast exam   Musculoskeletal: She exhibits no edema or tenderness.  Lymphadenopathy:    She has no cervical adenopathy.  Neurological: She is alert. She has normal reflexes. No cranial nerve deficit. She exhibits normal muscle tone. Coordination normal.  Skin: Skin is warm and dry. No rash noted. No erythema. No pallor.  bilat 2nd toes are wrapped   Psychiatric: She has a normal mood and affect.          Assessment & Plan:   Problem List Items Addressed This Visit      Cardiovascular and Mediastinum   Essential hypertension - Primary    bp in fair control at this time  BP Readings from Last 1 Encounters:  05/07/15 130/60   No changes needed Disc lifstyle change with low sodium diet and exercise  Labs reviewed       Relevant Medications   losartan-hydrochlorothiazide (HYZAAR) 100-12.5 MG tablet     Endocrine   Diabetes type 2, controlled ([place])    bp in fair control at this time  BP Readings from Last 1 Encounters:  05/07/15 130/60   No changes needed Disc lifstyle change with low sodium diet and exercise   Labs reviewed       Relevant Medications   metFORMIN (GLUCOPHAGE) 500 MG tablet    losartan-hydrochlorothiazide (HYZAAR) 100-12.5 MG tablet     Other  Encounter for Medicare annual wellness exam    Reviewed appropriate screening tests for age  Also reviewed health mt list, fam hx and immunization status , as well as social and family history   See HPI Labs reviewed  Blood pressure is better on 2nd check  Try to get 2000 iu vitamin D daily  Flu shot today  Get established with a eye doctor  Labs are stable  Continue follow up with foot doctor for foot ulcers  Pt declines some cancer screening at her age       63    In pt intolerant of cholesterol medication Disc goals for lipids and reasons to control them Rev labs with pt Rev low sat fat diet in detail She is not interested in ref to cardiol to disc other opt for this tx at this time       Relevant Medications   losartan-hydrochlorothiazide (HYZAAR) 100-12.5 MG tablet   Routine general medical examination at a health care facility    Reviewed health habits including diet and exercise and skin cancer prevention Reviewed appropriate screening tests for age  Also reviewed health mt list, fam hx and immunization status , as well as social and family history   See HPI Labs reviewed  Blood pressure is better on 2nd check  Try to get 2000 iu vitamin D daily  Flu shot today  Get established with a eye doctor  Labs are stable  Continue follow up with foot doctor for foot ulcers        Other Visit Diagnoses    Need for influenza vaccination        Relevant Orders    Flu Vaccine QUAD 36+ mos PF IM (Fluarix & Fluzone Quad PF) (Completed)

## 2015-05-09 NOTE — Assessment & Plan Note (Signed)
bp in fair control at this time  BP Readings from Last 1 Encounters:  05/07/15 130/60   No changes needed Disc lifstyle change with low sodium diet and exercise  Labs reviewed  

## 2015-05-09 NOTE — Assessment & Plan Note (Signed)
Reviewed appropriate screening tests for age  Also reviewed health mt list, fam hx and immunization status , as well as social and family history   See HPI Labs reviewed  Blood pressure is better on 2nd check  Try to get 2000 iu vitamin D daily  Flu shot today  Get established with a eye doctor  Labs are stable  Continue follow up with foot doctor for foot ulcers  Pt declines some cancer screening at her age

## 2015-05-09 NOTE — Assessment & Plan Note (Signed)
bp in fair control at this time  BP Readings from Last 1 Encounters:  05/07/15 130/60   No changes needed Disc lifstyle change with low sodium diet and exercise  Labs reviewed

## 2015-05-09 NOTE — Assessment & Plan Note (Signed)
Reviewed health habits including diet and exercise and skin cancer prevention Reviewed appropriate screening tests for age  Also reviewed health mt list, fam hx and immunization status , as well as social and family history   See HPI Labs reviewed  Blood pressure is better on 2nd check  Try to get 2000 iu vitamin D daily  Flu shot today  Get established with a eye doctor  Labs are stable  Continue follow up with foot doctor for foot ulcers

## 2015-05-09 NOTE — Assessment & Plan Note (Signed)
In pt intolerant of cholesterol medication Disc goals for lipids and reasons to control them Rev labs with pt Rev low sat fat diet in detail She is not interested in ref to cardiol to disc other opt for this tx at this time

## 2015-08-30 ENCOUNTER — Other Ambulatory Visit: Payer: Self-pay | Admitting: Family Medicine

## 2015-10-30 ENCOUNTER — Other Ambulatory Visit (INDEPENDENT_AMBULATORY_CARE_PROVIDER_SITE_OTHER): Payer: Medicare Other

## 2015-10-30 DIAGNOSIS — I10 Essential (primary) hypertension: Secondary | ICD-10-CM

## 2015-10-30 DIAGNOSIS — E1122 Type 2 diabetes mellitus with diabetic chronic kidney disease: Secondary | ICD-10-CM

## 2015-10-30 LAB — COMPREHENSIVE METABOLIC PANEL
ALT: 8 U/L (ref 0–35)
AST: 15 U/L (ref 0–37)
Albumin: 4 g/dL (ref 3.5–5.2)
Alkaline Phosphatase: 59 U/L (ref 39–117)
BUN: 19 mg/dL (ref 6–23)
CALCIUM: 10 mg/dL (ref 8.4–10.5)
CHLORIDE: 103 meq/L (ref 96–112)
CO2: 28 meq/L (ref 19–32)
Creatinine, Ser: 1.19 mg/dL (ref 0.40–1.20)
GFR: 46.02 mL/min — AB (ref >60.00)
GLUCOSE: 113 mg/dL — AB (ref 70–99)
POTASSIUM: 4.2 meq/L (ref 3.5–5.1)
SODIUM: 139 meq/L (ref 135–145)
TOTAL PROTEIN: 7.2 g/dL (ref 6.0–8.3)
Total Bilirubin: 1.3 mg/dL — ABNORMAL HIGH (ref 0.2–1.2)

## 2015-10-30 LAB — HEMOGLOBIN A1C: Hgb A1c MFr Bld: 5.7 % (ref 4.6–6.5)

## 2015-11-05 ENCOUNTER — Ambulatory Visit (INDEPENDENT_AMBULATORY_CARE_PROVIDER_SITE_OTHER): Payer: Medicare Other | Admitting: Family Medicine

## 2015-11-05 ENCOUNTER — Encounter: Payer: Self-pay | Admitting: Family Medicine

## 2015-11-05 VITALS — BP 132/74 | HR 89 | Temp 98.4°F | Ht 60.5 in | Wt 172.2 lb

## 2015-11-05 DIAGNOSIS — I10 Essential (primary) hypertension: Secondary | ICD-10-CM

## 2015-11-05 DIAGNOSIS — E669 Obesity, unspecified: Secondary | ICD-10-CM | POA: Diagnosis not present

## 2015-11-05 DIAGNOSIS — E7800 Pure hypercholesterolemia, unspecified: Secondary | ICD-10-CM | POA: Diagnosis not present

## 2015-11-05 DIAGNOSIS — E1122 Type 2 diabetes mellitus with diabetic chronic kidney disease: Secondary | ICD-10-CM

## 2015-11-05 MED ORDER — TOLTERODINE TARTRATE ER 4 MG PO CP24: 4.0000 mg | ORAL_CAPSULE | Freq: Every day | ORAL | Status: DC

## 2015-11-05 MED ORDER — LOSARTAN POTASSIUM-HCTZ 100-12.5 MG PO TABS: 1.0000 | ORAL_TABLET | Freq: Every day | ORAL | Status: DC

## 2015-11-05 MED ORDER — GABAPENTIN 300 MG PO CAPS: ORAL_CAPSULE | ORAL | Status: DC

## 2015-11-05 MED ORDER — METFORMIN HCL 500 MG PO TABS: 500.0000 mg | ORAL_TABLET | Freq: Two times a day (BID) | ORAL | Status: DC

## 2015-11-05 NOTE — Patient Instructions (Signed)
I'm glad you are doing well  Stay as active as you can- physically and mentally Labs are stable  Follow up in 6 months for medicare visit and physical

## 2015-11-05 NOTE — Assessment & Plan Note (Signed)
Discussed how this problem influences overall health and the risks it imposes  Reviewed plan for weight loss with lower calorie diet (via better food choices and also portion control or program like weight watchers) and exercise building up to or more than 30 minutes 5 days per week including some aerobic activity   Her exercise is limited- I enc her to keep walking safely in house with walker

## 2015-11-05 NOTE — Progress Notes (Signed)
Pre visit review using our clinic review tool, if applicable. No additional management support is needed unless otherwise documented below in the visit note. 

## 2015-11-05 NOTE — Assessment & Plan Note (Signed)
bp in fair control at this time  BP Readings from Last 1 Encounters:  11/05/15 132/74   No changes needed Disc lifstyle change with low sodium diet and exercise  Doing well- refilled medications Wt loss enc

## 2015-11-05 NOTE — Assessment & Plan Note (Signed)
Lab Results  Component Value Date   HGBA1C 5.7 10/30/2015   This continues to be very well controlled for her on metformin  DM diet and wt loss enc  utd podiatry visits - left her L 3rd toe bandaged today

## 2015-11-05 NOTE — Progress Notes (Signed)
Subjective:    Patient ID: Kristina Bennett, female    DOB: 1930/10/01, 82 y.o.   MRN: [id number]  HPI Here for f/u of chronic medical problems   Doing "allright"  Nothing new going on  Does c/o about getting older  Everything is a little harder - talks to her sister a lot (is turning 64)   Wt is up 1 lb with bmi of 33  Diet is ok  occ drinks glucerna instead of eating dinner  No hypoglycemia    bp is stable today  No cp or palpitations or headaches or edema  No side effects to medicines  BP Readings from Last 3 Encounters:  11/05/15 132/74  05/07/15 130/60  03/07/15 152/80     DM is in very good control  Lab Results  Component Value Date   HGBA1C 5.7 10/30/2015   This is up from 5.5   Cholesterol- has been high in the past  Intol to statins     Chemistry      Component Value Date/Time   NA 139 10/30/2015 1239   NA 140 01/02/2014 1528   K 4.2 10/30/2015 1239   K 3.3* 01/02/2014 1528   CL 103 10/30/2015 1239   CL 102 01/02/2014 1528   CO2 28 10/30/2015 1239   CO2 27 01/02/2014 1528   BUN 19 10/30/2015 1239   BUN 18 01/02/2014 1528   CREATININE 1.19 10/30/2015 1239   CREATININE 1.24 01/02/2014 1528      Component Value Date/Time   CALCIUM 10.0 10/30/2015 1239   CALCIUM 9.3 01/02/2014 1528   ALKPHOS 59 10/30/2015 1239   AST 15 10/30/2015 1239   ALT 8 10/30/2015 1239   BILITOT 1.3* 10/30/2015 1239      Good GFR -doing well with that   Not a lot of exercise - she does walk with walker in the house  Gets out to shop occasionally  Has to elevate feet for her toe problem - wraps it daily  Has not seen podiatry since last visit here    Patient Active Problem List   Diagnosis Date Noted  . Routine general medical examination at a health care facility 05/07/2015  . Encounter for Medicare annual wellness exam 05/01/2014  . Preop examination 12/20/2013  . Pre-ulcerative calluses 11/01/2013  . Toe ulcer due to DM ([place]) 03/29/2013  . EDEMA 11/14/2009  .  Disorder resulting from impaired renal function 09/24/2009  . VARICOSE VEINS, LOWER EXTREMITIES 12/05/2008  . NECK MASS 11/22/2007  . Anemia 07/05/2007  . Diabetes type 2, controlled ([place]) 04/08/2007  . NEPHROPATHY, DIABETIC 04/08/2007  . DIABETIC PERIPHERAL NEUROPATHY 04/08/2007  . HYPERCHOLESTEROLEMIA 04/08/2007  . MACULAR DEGENERATION 04/08/2007  . Essential hypertension 04/08/2007  . RHEUMATOID ARTHRITIS 04/08/2007  . ADHESIVE CAPSULITIS, BILATERAL 04/08/2007  . URINARY INCONTINENCE 04/08/2007  . NEPHROLITHIASIS, HX OF 04/08/2007   Past Medical History  Diagnosis Date  . DM2 (diabetes mellitus, type 2) ([place])   . HTN (hypertension)   . Nephrolithiasis     hx  . Rheumatoid arthritis(714.0)   . Urinary incontinence   . Anemia   . Umbilical hernia   . Macular degeneration   . Renal insufficiency     poss renal vasc dz  . Kidney congenitally absent, left     small  . Diabetes mellitus   . Rheumatoid arthritis(714.0)   . Anemia   . Macular degeneration   . Renal insufficiency    Past Surgical History  Procedure Laterality Date  .  Cataract signs    . 411 echo      mild diastolic dysfunction EF 28%, slt pulm HTN    Social History  Substance Use Topics  . Smoking status: Never Smoker   . Smokeless tobacco: None  . Alcohol Use: No   Family History  Problem Relation Age of Onset  . Heart attack Mother   . Prostate cancer Father    Allergies  Allergen Reactions  . Ace Inhibitors     REACTION: ? reaction  . Atorvastatin     REACTION: ? reaction  . Etodolac     REACTION: swelling and dizziness  . Glipizide     REACTION: low sugar  . Metformin     REACTION: inc cr  . Metoprolol Tartrate     REACTION: ? reaction  . Propoxyphene N-Acetaminophen     REACTION: ? reaction  . Rosuvastatin     REACTION: ? reaction  . Tolterodine Tartrate     REACTION: ? reaction   Current Outpatient Prescriptions on File Prior to Visit  Medication Sig Dispense Refill  . Blood  Glucose Monitoring Suppl (CONTOUR BLOOD GLUCOSE SYSTEM) W/DEVICE KIT Use to check blood sugar once daily     . cephALEXin (KEFLEX) 500 MG capsule Take 1 capsule (500 mg total) by mouth 3 (three) times daily. 30 capsule 2  . gabapentin (NEURONTIN) 300 MG capsule TAKE 1 CAPSULES TWICE A DAY    . glucose blood (BAYER CONTOUR TEST) test strip 1 each by Other route daily. Use as instructed     . losartan-hydrochlorothiazide (HYZAAR) 100-12.5 MG tablet Take 1 tablet by mouth daily. 90 tablet 3  . metFORMIN (GLUCOPHAGE) 500 MG tablet Take 1 tablet (500 mg total) by mouth 2 (two) times daily. 180 tablet 3  . mupirocin ointment (BACTROBAN) 2 % Apply 1 application topically 2 (two) times daily. To affected area on foot 22 g 3  . silver sulfADIAZINE (SILVADENE) 1 % cream Apply 1 application topically daily. 50 g 0  . tolterodine (DETROL LA) 4 MG 24 hr capsule TAKE 1 CAPSULE DAILY 90 capsule 0   No current facility-administered medications on file prior to visit.    Review of Systems Review of Systems  Constitutional: Negative for fever, appetite change, fatigue and unexpected weight change.  Eyes: Negative for pain and visual disturbance.  Respiratory: Negative for cough and shortness of breath.   Cardiovascular: Negative for cp or palpitations    Gastrointestinal: Negative for nausea, diarrhea and constipation.  Genitourinary: Negative for urgency and frequency.  Skin: Negative for pallor or rash   Neurological: Negative for weakness, light-headedness, numbness and headaches.  Hematological: Negative for adenopathy. Does not bruise/bleed easily.  Psychiatric/Behavioral: Negative for dysphoric mood. The patient is not nervous/anxious.         Objective:   Physical Exam  Constitutional: She appears well-developed and well-nourished. No distress.  obese and well appearing   HENT:  Head: Normocephalic and atraumatic.  Mouth/Throat: Oropharynx is clear and moist.  Eyes: Conjunctivae and EOM are  normal. Pupils are equal, round, and reactive to light.  Neck: Normal range of motion. Neck supple. No JVD present. Carotid bruit is not present. No thyromegaly present.  Cardiovascular: Normal rate, regular rhythm, normal heart sounds and intact distal pulses.  Exam reveals no gallop.   Pulmonary/Chest: Effort normal and breath sounds normal. No respiratory distress. She has no wheezes. She has no rales.  No crackles  Abdominal: Soft. Bowel sounds are normal. She exhibits no distension,  no abdominal bruit and no mass. There is no tenderness.  Musculoskeletal: She exhibits no edema.  R third toe is wrapped   Lymphadenopathy:    She has no cervical adenopathy.  Neurological: She is alert. She has normal reflexes. No cranial nerve deficit. She exhibits normal muscle tone. Coordination normal.  Skin: Skin is warm and dry. No rash noted.  Psychiatric: She has a normal mood and affect.          Assessment & Plan:   Problem List Items Addressed This Visit      Cardiovascular and Mediastinum   Essential hypertension - Primary    bp in fair control at this time  BP Readings from Last 1 Encounters:  11/05/15 132/74   No changes needed Disc lifstyle change with low sodium diet and exercise  Doing well- refilled medications Wt loss enc      Relevant Medications   losartan-hydrochlorothiazide (HYZAAR) 100-12.5 MG tablet     Endocrine   Diabetes type 2, controlled ([place])    Lab Results  Component Value Date   HGBA1C 5.7 10/30/2015   This continues to be very well controlled for her on metformin  DM diet and wt loss enc  utd podiatry visits - left her L 3rd toe bandaged today      Relevant Medications   losartan-hydrochlorothiazide (HYZAAR) 100-12.5 MG tablet   metFORMIN (GLUCOPHAGE) 500 MG tablet     Other   HYPERCHOLESTEROLEMIA    Will check lipids in 6 mo  Intol of statins  Rev low sat fat diet  Rev last lipids      Relevant Medications   losartan-hydrochlorothiazide  (HYZAAR) 100-12.5 MG tablet   Obesity    Discussed how this problem influences overall health and the risks it imposes  Reviewed plan for weight loss with lower calorie diet (via better food choices and also portion control or program like weight watchers) and exercise building up to or more than 30 minutes 5 days per week including some aerobic activity   Her exercise is limited- I enc her to keep walking safely in house with walker      Relevant Medications   metFORMIN (GLUCOPHAGE) 500 MG tablet

## 2015-11-05 NOTE — Assessment & Plan Note (Signed)
Will check lipids in 6 mo  Intol of statins  Rev low sat fat diet  Rev last lipids

## 2016-05-12 ENCOUNTER — Telehealth: Payer: Self-pay | Admitting: Family Medicine

## 2016-05-12 DIAGNOSIS — Z0000 Encounter for general adult medical examination without abnormal findings: Secondary | ICD-10-CM

## 2016-05-12 DIAGNOSIS — E1122 Type 2 diabetes mellitus with diabetic chronic kidney disease: Secondary | ICD-10-CM

## 2016-05-12 NOTE — Telephone Encounter (Signed)
-----   Message from Robert Bellow, LPN sent at [id number]  8:11 PM EDT ----- Regarding: Lab Orders for 05/13/16 Please place lab orders for 05/13/16. Thank you. :)

## 2016-05-13 ENCOUNTER — Ambulatory Visit (INDEPENDENT_AMBULATORY_CARE_PROVIDER_SITE_OTHER): Payer: Medicare Other

## 2016-05-13 VITALS — BP 142/80 | HR 85 | Temp 97.8°F | Ht 61.0 in | Wt 172.2 lb

## 2016-05-13 DIAGNOSIS — Z23 Encounter for immunization: Secondary | ICD-10-CM | POA: Diagnosis not present

## 2016-05-13 DIAGNOSIS — Z0000 Encounter for general adult medical examination without abnormal findings: Secondary | ICD-10-CM | POA: Diagnosis not present

## 2016-05-13 DIAGNOSIS — E1122 Type 2 diabetes mellitus with diabetic chronic kidney disease: Secondary | ICD-10-CM

## 2016-05-13 LAB — CBC WITH DIFFERENTIAL/PLATELET
BASOS ABS: 0 10*3/uL (ref 0.0–0.1)
Basophils Relative: 0.4 % (ref 0.0–3.0)
EOS ABS: 0.1 10*3/uL (ref 0.0–0.7)
Eosinophils Relative: 2.4 % (ref 0.0–5.0)
HEMATOCRIT: 34 % — AB (ref 36.0–46.0)
Hemoglobin: 11.7 g/dL — ABNORMAL LOW (ref 12.0–15.0)
LYMPHS PCT: 26.8 % (ref 12.0–46.0)
Lymphs Abs: 1.3 10*3/uL (ref 0.7–4.0)
MCHC: 34.5 g/dL (ref 30.0–36.0)
MCV: 91.7 fl (ref 78.0–100.0)
Monocytes Absolute: 0.5 10*3/uL (ref 0.1–1.0)
Monocytes Relative: 9.9 % (ref 3.0–12.0)
NEUTROS ABS: 3 10*3/uL (ref 1.4–7.7)
Neutrophils Relative %: 60.5 % (ref 43.0–77.0)
PLATELETS: 164 10*3/uL (ref 150.0–400.0)
RBC: 3.71 Mil/uL — AB (ref 3.87–5.11)
RDW: 13.8 % (ref 11.5–15.5)
WBC: 5 10*3/uL (ref 4.0–10.5)

## 2016-05-13 LAB — LIPID PANEL
CHOL/HDL RATIO: 5
CHOLESTEROL: 254 mg/dL — AB (ref 0–200)
HDL: 55.6 mg/dL (ref >39.00)
LDL CALC: 173 mg/dL — AB (ref 0–99)
NonHDL: 198.63
Triglycerides: 126 mg/dL (ref 0.0–149.0)
VLDL: 25.2 mg/dL (ref 0.0–40.0)

## 2016-05-13 LAB — COMPREHENSIVE METABOLIC PANEL
ALT: 12 U/L (ref 0–35)
AST: 15 U/L (ref 0–37)
Albumin: 4.1 g/dL (ref 3.5–5.2)
Alkaline Phosphatase: 66 U/L (ref 39–117)
BILIRUBIN TOTAL: 0.9 mg/dL (ref 0.2–1.2)
BUN: 21 mg/dL (ref 6–23)
CALCIUM: 10 mg/dL (ref 8.4–10.5)
CO2: 28 meq/L (ref 19–32)
CREATININE: 1.27 mg/dL — AB (ref 0.40–1.20)
Chloride: 102 mEq/L (ref 96–112)
GFR: 42.64 mL/min — ABNORMAL LOW (ref >60.00)
GLUCOSE: 86 mg/dL (ref 70–99)
Potassium: 4.2 mEq/L (ref 3.5–5.1)
Sodium: 138 mEq/L (ref 135–145)
TOTAL PROTEIN: 7.3 g/dL (ref 6.0–8.3)

## 2016-05-13 LAB — HEMOGLOBIN A1C: Hgb A1c MFr Bld: 5.5 % (ref 4.6–6.5)

## 2016-05-13 LAB — TSH: TSH: 0.73 u[IU]/mL (ref 0.35–4.50)

## 2016-05-13 NOTE — Progress Notes (Signed)
Pre visit review using our clinic review tool, if applicable. No additional management support is needed unless otherwise documented below in the visit note. 

## 2016-05-13 NOTE — Progress Notes (Signed)
PCP notes:   Health maintenance:  Flu vaccine - administered Eye exam- pt has declined to see eye doctor at this time Bone density - pt declined A1C - completed  Abnormal screenings:   Hearing - failed Mini-Cog score: 18/20  Patient concerns:   None  Nurse concerns:  None  Next PCP appt:   05/27/2016 @ 1430  I reviewed health advisor's note, was available for consultation, and agree with documentation and plan. Roxy Manns MD

## 2016-05-13 NOTE — Patient Instructions (Signed)
Ms. Laden , Thank you for taking time to come for your Medicare Wellness Visit. I appreciate your ongoing commitment to your health goals. Please review the following plan we discussed and let me know if I can assist you in the future.   These are the goals we discussed: Goals    . other          Starting 05/13/2016, I will begin to focus more on details in an effort to improve recall.        This is a list of the screening recommended for you and due dates:  Health Maintenance  Topic Date Due  . Eye exam for diabetics  05/13/2017*  . Shingles Vaccine  11/24/2020*  . Mammogram  06/27/2021*  . DEXA scan (bone density measurement)  05/13/2026*  . Complete foot exam   11/04/2016  . Hemoglobin A1C  11/11/2016  . Tetanus Vaccine  06/11/2021  . Flu Shot  Completed  . Pneumonia vaccines  Completed  *Topic was postponed. The date shown is not the original due date.   Preventive Care for Adults  A healthy lifestyle and preventive care can promote health and wellness. Preventive health guidelines for adults include the following key practices.  . A routine yearly physical is a good way to check with your health care provider about your health and preventive screening. It is a chance to share any concerns and updates on your health and to receive a thorough exam.  . Visit your dentist for a routine exam and preventive care every 6 months. Brush your teeth twice a day and floss once a day. Good oral hygiene prevents tooth decay and gum disease.  . The frequency of eye exams is based on your age, health, family medical history, use  of contact lenses, and other factors. Follow your health care provider's ecommendations for frequency of eye exams.  . Eat a healthy diet. Foods like vegetables, fruits, whole grains, low-fat dairy products, and lean protein foods contain the nutrients you need without too many calories. Decrease your intake of foods high in solid fats, added sugars, and salt.  Eat the right amount of calories for you. Get information about a proper diet from your health care provider, if necessary.  . Regular physical exercise is one of the most important things you can do for your health. Most adults should get at least 150 minutes of moderate-intensity exercise (any activity that increases your heart rate and causes you to sweat) each week. In addition, most adults need muscle-strengthening exercises on 2 or more days a week.  Silver Sneakers may be a benefit available to you. To determine eligibility, you may visit the website: www.silversneakers.com or contact program at [phone] Mon-Fri between 8AM-8PM.   . Maintain a healthy weight. The body mass index (BMI) is a screening tool to identify possible weight problems. It provides an estimate of body fat based on height and weight. Your health care provider can find your BMI and can help you achieve or maintain a healthy weight.   For adults 20 years and older: ? A BMI below 18.5 is considered underweight. ? A BMI of 18.5 to 24.9 is normal. ? A BMI of 25 to 29.9 is considered overweight. ? A BMI of 30 and above is considered obese.   . Maintain normal blood lipids and cholesterol levels by exercising and minimizing your intake of saturated fat. Eat a balanced diet with plenty of fruit and vegetables. Blood tests for lipids and cholesterol  should begin at age 87 and be repeated every 5 years. If your lipid or cholesterol levels are high, you are over 50, or you are at high risk for heart disease, you may need your cholesterol levels checked more frequently. Ongoing high lipid and cholesterol levels should be treated with medicines if diet and exercise are not working.  . If you smoke, find out from your health care provider how to quit. If you do not use tobacco, please do not start.  . If you choose to drink alcohol, please do not consume more than 2 drinks per day. One drink is considered to be 12 ounces (355  mL) of beer, 5 ounces (148 mL) of wine, or 1.5 ounces (44 mL) of liquor.  . If you are 37-12 years old, ask your health care provider if you should take aspirin to prevent strokes.  . Use sunscreen. Apply sunscreen liberally and repeatedly throughout the day. You should seek shade when your shadow is shorter than you. Protect yourself by wearing long sleeves, pants, a wide-brimmed hat, and sunglasses year round, whenever you are outdoors.  . Once a month, do a whole body skin exam, using a mirror to look at the skin on your back. Tell your health care provider of new moles, moles that have irregular borders, moles that are larger than a pencil eraser, or moles that have changed in shape or color.

## 2016-05-13 NOTE — Progress Notes (Signed)
Subjective:   Kristina Bennett is a 82 y.o. female who presents for Medicare Annual (Subsequent) preventive examination.  Review of Systems:  N/A Cardiac Risk Factors include: advanced age (>31mn, >>24women);obesity (BMI >30kg/m2);diabetes mellitus;dyslipidemia;hypertension     Objective:     Vitals: BP (!) 142/80 (BP Location: Left Arm, Patient Position: Sitting, Cuff Size: Normal)   Pulse 85   Temp 97.8 F (36.6 C) (Oral)   Ht _0  (1.549 m) Comment: no shoes  Wt 172 lb 4 oz (78.1 kg)   SpO2 96%   BMI 32.55 kg/m   Body mass index is 32.55 kg/m.   Tobacco History  Smoking Status  . Never Smoker  Smokeless Tobacco  . Never Used     Counseling given: No   Past Medical History:  Diagnosis Date  . Anemia   . Anemia   . Diabetes mellitus   . DM[place]   . HTN (hypertension)   . Kidney congenitally absent, left    small  . Macular degeneration   . Macular degeneration   . Nephrolithiasis    hx  . Renal insufficiency    poss renal vasc dz  . Renal insufficiency   . Rheumatoid arthritis(714.0)   . Rheumatoid arthritis(714.0)   . Umbilical hernia   . Urinary incontinence    Past Surgical History:  Procedure Laterality Date  . 411 echo     mild diastolic dysfunction EF 600% slt pulm HTN   . cataract signs     Family History  Problem Relation Age of Onset  . Heart attack Mother   . Prostate cancer Father    History  Sexual Activity  . Sexual activity: No    Outpatient Encounter Prescriptions as of 05/13/2016  Medication Sig  . Blood Glucose Monitoring Suppl (CONTOUR BLOOD GLUCOSE SYSTEM) W/DEVICE KIT Use to check blood sugar once daily   . gabapentin (NEURONTIN) 300 MG capsule TAKE 1 CAPSULES TWICE A DAY  . glucose blood (BAYER CONTOUR TEST) test strip 1 each by Other route daily. Use as instructed   . losartan-hydrochlorothiazide (HYZAAR) 100-12.5 MG tablet Take 1 tablet by mouth daily.  . metFORMIN (GLUCOPHAGE) 500  MG tablet Take 1 tablet (500 mg total) by mouth 2 (two) times daily.  . mupirocin ointment (BACTROBAN) 2 % Apply 1 application topically 2 (two) times daily. To affected area on foot  . silver sulfADIAZINE (SILVADENE) 1 % cream Apply 1 application topically daily.  .Marland Kitchentolterodine (DETROL LA) 4 MG 24 hr capsule Take 1 capsule (4 mg total) by mouth daily.  . [DISCONTINUED] cephALEXin (KEFLEX) 500 MG capsule Take 1 capsule (500 mg total) by mouth 3 (three) times daily.   No facility-administered encounter medications on file as of 05/13/2016.     Activities of Daily Living In your present state of health, do you have any difficulty performing the following activities: 05/13/2016  Hearing? N  Vision? Y  Difficulty concentrating or making decisions? N  Walking or climbing stairs? N  Dressing or bathing? N  Doing errands, shopping? Y  Preparing Food and eating ? N  Using the Toilet? N  In the past six months, have you accidently leaked urine? Y  Do you have problems with loss of bowel control? N  Managing your Medications? N  Managing your Finances? N  Housekeeping or managing your Housekeeping? Y  Some recent data might be hidden    Patient Care Team: MAbner Greenspan MD as PCP -  General Birder Robson, MD as Referring Physician (Ophthalmology) Cleotilde Neer, DPM as Referring Physician (Podiatry) Rosiland Oz, PA-C as Referring Physician (Physician Assistant)    Assessment:     Hearing Screening   _0  _1  _2  _3  _4  _5  _6  _7  _8   Right ear:   0 0 40  0    Left ear:   0 0 40  40      Visual Acuity Screening   Right eye Left eye Both eyes  Without correction: 0 20/50 20/50  With correction:     Comments: Pt was unable to read any letters on eye chart with right eye including the &quot;E&quot; at 20/200   Exercise Activities and Dietary recommendations Current Exercise Habits: The patient does not participate in regular exercise at present,  Exercise limited by: orthopedic condition(s)  Goals    . other          Starting 05/13/2016, I will begin to focus more on details in an effort to improve recall.       Fall Risk Fall Risk  05/13/2016 05/07/2015 05/01/2014 11/02/2013 06/27/2012  Falls in the past year? _9    Depression Screen PHQ 2/9 Scores 05/13/2016 05/07/2015 05/01/2014 11/02/2013  PHQ - 2 Score 0 0 0 0     Cognitive Function MMSE - Mini Mental State Exam 05/13/2016  Orientation to time 5  Orientation to Place 5  Registration 3  Attention/ Calculation 0  Recall 1  Recall-comments pt was unable to recall 2 of 3 words  Language- name 2 objects 0  Language- repeat 1  Language- follow 3 step command 3  Language- read & follow direction 0  Write a sentence 0  Copy design 0  Total score 18     PLEASE NOTE: A Mini-Cog screen was completed. Maximum score is 20. A value of 0 denotes this part of [place] MMSE was not completed or the patient failed this part of the Mini-Cog screening.   Mini-Cog Screening Orientation to Time - Max 5 pts Orientation to Place - Max 5 pts Registration - Max 3 pts Recall - Max 3 pts Language Repeat - Max 1 pts Language Follow 3 Step Command - Max 3 pts     Immunization History  Administered Date(s) Administered  . Influenza Split 05/19/2011, 05/17/2012  . Influenza Whole 05/05/2006, 05/20/2007, 04/16/2008, 05/14/2009, 04/30/2010  . Influenza,inj,Quad PF,36+ Mos 03/29/2013, 05/01/2014, 05/07/2015, 05/13/2016  . Pneumococcal Conjugate-13 05/01/2014  . Pneumococcal Polysaccharide-23 05/10/1996, 12/05/2008  . Tdap 06/12/2011   Screening Tests Health Maintenance  Topic Date Due  . OPHTHALMOLOGY EXAM  05/13/2017 (Originally 10/31/2014)  . ZOSTAVAX  11/24/2020 (Originally 08/26/1992)  . MAMMOGRAM  06/27/2021 (Originally 05/20/2002)  . DEXA SCAN  05/13/2026 (Originally 08/26/1997)  . FOOT EXAM  11/04/2016  . HEMOGLOBIN A1C  11/11/2016  . TETANUS/TDAP  06/11/2021  .  INFLUENZA VACCINE  Completed  . PNA vac Low Risk Adult  Completed      Plan:     I have personally reviewed and addressed the Medicare Annual Wellness questionnaire and have noted the following in the patient's chart:  A. Medical and social history B. Use of alcohol, tobacco or illicit drugs  C. Current medications and supplements D. Functional ability and status E.  Nutritional status F.  Physical activity G. Advance directives H. List of other physicians I.  Hospitalizations, surgeries, and ER visits in previous 12 months J.  [place] to include hearing, vision, cognitive, depression L. Referrals and  appointments - none  In addition, I have reviewed and discussed with patient certain preventive protocols, quality metrics, and best practice recommendations. A written personalized care plan for preventive services as well as general preventive health recommendations were provided to patient.  See attached scanned questionnaire for additional information.   Signed,   Lindell Noe, MHA, BS, LPN Health Coach

## 2016-05-27 ENCOUNTER — Ambulatory Visit (INDEPENDENT_AMBULATORY_CARE_PROVIDER_SITE_OTHER): Payer: Medicare Other | Admitting: Family Medicine

## 2016-05-27 ENCOUNTER — Encounter: Payer: Self-pay | Admitting: Family Medicine

## 2016-05-27 VITALS — BP 126/64 | HR 85 | Temp 97.4°F | Ht 61.0 in | Wt 174.5 lb

## 2016-05-27 DIAGNOSIS — E1122 Type 2 diabetes mellitus with diabetic chronic kidney disease: Secondary | ICD-10-CM | POA: Diagnosis not present

## 2016-05-27 DIAGNOSIS — Z0000 Encounter for general adult medical examination without abnormal findings: Secondary | ICD-10-CM

## 2016-05-27 DIAGNOSIS — I10 Essential (primary) hypertension: Secondary | ICD-10-CM | POA: Diagnosis not present

## 2016-05-27 DIAGNOSIS — E1121 Type 2 diabetes mellitus with diabetic nephropathy: Secondary | ICD-10-CM

## 2016-05-27 DIAGNOSIS — E11621 Type 2 diabetes mellitus with foot ulcer: Secondary | ICD-10-CM

## 2016-05-27 DIAGNOSIS — E1142 Type 2 diabetes mellitus with diabetic polyneuropathy: Secondary | ICD-10-CM

## 2016-05-27 DIAGNOSIS — E7800 Pure hypercholesterolemia, unspecified: Secondary | ICD-10-CM

## 2016-05-27 DIAGNOSIS — N289 Disorder of kidney and ureter, unspecified: Secondary | ICD-10-CM

## 2016-05-27 DIAGNOSIS — L97519 Non-pressure chronic ulcer of other part of right foot with unspecified severity: Secondary | ICD-10-CM

## 2016-05-27 DIAGNOSIS — E6609 Other obesity due to excess calories: Secondary | ICD-10-CM

## 2016-05-27 DIAGNOSIS — D649 Anemia, unspecified: Secondary | ICD-10-CM

## 2016-05-27 DIAGNOSIS — Z6832 Body mass index (BMI) 32.0-32.9, adult: Secondary | ICD-10-CM

## 2016-05-27 NOTE — Progress Notes (Signed)
Pre visit review using our clinic review tool, if applicable. No additional management support is needed unless otherwise documented below in the visit note. 

## 2016-05-27 NOTE — Progress Notes (Signed)
Subjective:    Patient ID: Kristina Bennett, female    DOB: January 06, 1931, 82 y.o.   MRN: [id number]  HPI Here for health maintenance exam and to review chronic medical problems    Feeling "just fine"  Toe is getting better -almost healed after going to a different doctor and abx   Had AMW visit with Katha Cabal  Flu vaccine given  Declined eye exam -hx of macular degeneration -"does not care to go back" - since they cannot do much for her  Declined dexa -no falls or fractures  Failed hearing exam - pt states it does not bother her , also does not bother family at all  Does not have hearing aides   Mini cog 18/20 -missed 2 recall questions  No problems at home  No confusion  Family will watch her closely  Son lives with her and daughter calls her daily    She declines mammograms  Also zoster vaccine   Other imms utd including flu shot    Wt Readings from Last 3 Encounters:  05/27/16 174 lb 8 oz (79.2 kg)  05/13/16 172 lb 4 oz (78.1 kg)  11/05/15 172 lb 4 oz (78.1 kg)  eating fairly /appetite is "too good" Her son loves to cook  bmi is 32.9  Cannot get much exercise with her toe problem (walking bothers her )  ifob neg in 2008   bp is stable today  No cp or palpitations or headaches or edema  No side effects to medicines  BP Readings from Last 3 Encounters:  05/27/16 126/64  05/13/16 (!) 142/80  11/05/15 132/74      DM2 with chronic kidney dz Lab Results  Component Value Date   HGBA1C 5.5 05/13/2016  in good control  This is improved   Chemistry      Component Value Date/Time   NA 138 05/13/2016 1303   NA 140 01/02/2014 1528   K 4.2 05/13/2016 1303   K 3.3 (L) 01/02/2014 1528   CL 102 05/13/2016 1303   CL 102 01/02/2014 1528   CO2 28 05/13/2016 1303   CO2 27 01/02/2014 1528   BUN 21 05/13/2016 1303   BUN 18 01/02/2014 1528   CREATININE 1.27 (H) 05/13/2016 1303   CREATININE 1.24 01/02/2014 1528      Component Value Date/Time   CALCIUM 10.0 05/13/2016  1303   CALCIUM 9.3 01/02/2014 1528   ALKPHOS 66 05/13/2016 1303   AST 15 05/13/2016 1303   ALT 12 05/13/2016 1303   BILITOT 0.9 05/13/2016 1303    She does take metformin  Also losartan hct   She thinks she is drinking enough water -family disagrees 20 oz per day- some diet sodas - 2-3 per day  One cup of coffee  Nephrologist did cut her metformin out (sulfonurea dropped her sugar out)   Hx of hyperlipidemia Lab Results  Component Value Date   CHOL 254 (H) 05/13/2016   CHOL 262 (H) 04/30/2015   CHOL 225 (H) 10/31/2014   Lab Results  Component Value Date   HDL 55.60 05/13/2016   HDL 68.50 04/30/2015   HDL 48.30 10/31/2014   Lab Results  Component Value Date   LDLCALC 173 (H) 05/13/2016   LDLCALC 178 (H) 04/30/2015   LDLCALC 152 (H) 05/01/2014   Lab Results  Component Value Date   TRIG 126.0 05/13/2016   TRIG 75.0 04/30/2015   TRIG 210.0 (H) 10/31/2014   Lab Results  Component Value Date   CHOLHDL  5 05/13/2016   CHOLHDL 4 04/30/2015   CHOLHDL 5 10/31/2014   Lab Results  Component Value Date   LDLDIRECT 144.0 10/31/2014   LDLDIRECT 166.5 06/21/2012   LDLDIRECT 141.0 11/18/2011   she is intolerant of statins  Does try to watch diet- son who cooks is aware   Hx of anemia Lab Results  Component Value Date   WBC 5.0 05/13/2016   HGB 11.7 (L) 05/13/2016   HCT 34.0 (L) 05/13/2016   MCV 91.7 05/13/2016   PLT 164.0 05/13/2016   this goes up and down with renal function   Lab Results  Component Value Date   TSH 0.73 05/13/2016     Patient Active Problem List   Diagnosis Date Noted  . Obesity 11/05/2015  . Routine general medical examination at a health care facility 05/07/2015  . Encounter for Medicare annual wellness exam 05/01/2014  . Pre-ulcerative calluses 11/01/2013  . Toe ulcer due to DM ([place]) 03/29/2013  . EDEMA 11/14/2009  . Renal insufficiency 09/24/2009  . VARICOSE VEINS, LOWER EXTREMITIES 12/05/2008  . Anemia 07/05/2007  . Diabetes type 2,  controlled ([place]) 04/08/2007  . Diabetic nephropathy ([place]) 04/08/2007  . Diabetic neuropathy ([place]) 04/08/2007  . HYPERCHOLESTEROLEMIA 04/08/2007  . MACULAR DEGENERATION 04/08/2007  . Essential hypertension 04/08/2007  . RHEUMATOID ARTHRITIS 04/08/2007  . ADHESIVE CAPSULITIS, BILATERAL 04/08/2007  . URINARY INCONTINENCE 04/08/2007  . NEPHROLITHIASIS, HX OF 04/08/2007   Past Medical History:  Diagnosis Date  . Anemia   . Anemia   . Diabetes mellitus   . DM2 (diabetes mellitus, type 2) ([place])   . HTN (hypertension)   . Kidney congenitally absent, left    small  . Macular degeneration   . Macular degeneration   . Nephrolithiasis    hx  . Renal insufficiency    poss renal vasc dz  . Renal insufficiency   . Rheumatoid arthritis(714.0)   . Rheumatoid arthritis(714.0)   . Umbilical hernia   . Urinary incontinence    Past Surgical History:  Procedure Laterality Date  . 411 echo     mild diastolic dysfunction EF 12%, slt pulm HTN   . cataract signs     Social History  Substance Use Topics  . Smoking status: Never Smoker  . Smokeless tobacco: Never Used  . Alcohol use No   Family History  Problem Relation Age of Onset  . Heart attack Mother   . Prostate cancer Father    Allergies  Allergen Reactions  . Ace Inhibitors     REACTION: ? reaction  . Atorvastatin     REACTION: ? reaction  . Etodolac     REACTION: swelling and dizziness  . Glipizide     REACTION: low sugar  . Metformin     REACTION: inc cr  . Metoprolol Tartrate     REACTION: ? reaction  . Propoxyphene N-Acetaminophen     REACTION: ? reaction  . Rosuvastatin     REACTION: ? reaction  . Tolterodine Tartrate     REACTION: ? reaction   Current Outpatient Prescriptions on File Prior to Visit  Medication Sig Dispense Refill  . Blood Glucose Monitoring Suppl (CONTOUR BLOOD GLUCOSE SYSTEM) W/DEVICE KIT Use to check blood sugar once daily     . gabapentin (NEURONTIN) 300 MG capsule TAKE 1 CAPSULES TWICE A  DAY 180 capsule 3  . glucose blood (BAYER CONTOUR TEST) test strip 1 each by Other route daily. Use as instructed     . losartan-hydrochlorothiazide (HYZAAR)  100-12.5 MG tablet Take 1 tablet by mouth daily. 90 tablet 3  . metFORMIN (GLUCOPHAGE) 500 MG tablet Take 1 tablet (500 mg total) by mouth 2 (two) times daily. 180 tablet 3  . mupirocin ointment (BACTROBAN) 2 % Apply 1 application topically 2 (two) times daily. To affected area on foot 22 g 3  . silver sulfADIAZINE (SILVADENE) 1 % cream Apply 1 application topically daily. 50 g 0  . tolterodine (DETROL LA) 4 MG 24 hr capsule Take 1 capsule (4 mg total) by mouth daily. 90 capsule 3   No current facility-administered medications on file prior to visit.     Review of Systems   Review of Systems  Constitutional: Negative for fever, appetite change, fatigue and unexpected weight change.  Eyes: Negative for pain and visual disturbance.  Respiratory: Negative for cough and shortness of breath.   Cardiovascular: Negative for cp or palpitations    Gastrointestinal: Negative for nausea, diarrhea and constipation.  Genitourinary: Negative for urgency and frequency.  Skin: Negative for pallor or rash   MSK pos for joint pain that prevents exercise  Neurological: Negative for weakness, light-headedness, numbness and headaches.  Hematological: Negative for adenopathy. Does not bruise/bleed easily.  Psychiatric/Behavioral: Negative for dysphoric mood. The patient is not nervous/anxious.         Objective:   Physical Exam  Constitutional: She appears well-developed and well-nourished. No distress.  Obese female  Cannot get up on to table/ mobility impaired   HENT:  Head: Normocephalic and atraumatic.  Right Ear: External ear normal.  Left Ear: External ear normal.  Mouth/Throat: Oropharynx is clear and moist.  Eyes: Conjunctivae and EOM are normal. Pupils are equal, round, and reactive to light. No scleral icterus.  Neck: Normal range of  motion. Neck supple. No JVD present. Carotid bruit is not present. No thyromegaly present.  Cardiovascular: Normal rate, regular rhythm, normal heart sounds and intact distal pulses.  Exam reveals no gallop.   Pulmonary/Chest: Effort normal and breath sounds normal. No respiratory distress. She has no wheezes. She exhibits no tenderness.  Abdominal: Soft. Bowel sounds are normal. She exhibits no distension, no abdominal bruit and no mass. There is no tenderness.  Genitourinary: No breast swelling, tenderness, discharge or bleeding.  Genitourinary Comments: Breast exam: No mass, nodules, thickening, tenderness, bulging, retraction, inflamation, nipple discharge or skin changes noted.  No axillary or clavicular LA.    Exam done sitting  Musculoskeletal: Normal range of motion. She exhibits no edema or tenderness.  Lymphadenopathy:    She has no cervical adenopathy.  Neurological: She is alert. She has normal reflexes. No cranial nerve deficit. She exhibits normal muscle tone. Coordination normal.  Skin: Skin is warm and dry. No rash noted. No erythema. No pallor.  Some sks and lentigines  Fair complexion   Psychiatric: She has a normal mood and affect.          Assessment & Plan:   Problem List Items Addressed This Visit      Cardiovascular and Mediastinum   Essential hypertension - Primary    bp in fair control at this time  BP Readings from Last 1 Encounters:  05/27/16 126/64   No changes needed Disc lifstyle change with low sodium diet and exercise  Labs reviewed        Endocrine   Diabetes type 2, controlled ([place])    Lab Results  Component Value Date   HGBA1C 5.5 05/13/2016   This is well controlled  Declines eye exam  at this time  Enc low glycemic diet / activity as tol and wt loss       Diabetic nephropathy (HCC)    Cr is up slightly  Glucose control is good  On losartan hct  Still on metformin       Diabetic neuropathy (HCC)    On gabapentin-which  helps No clinical changes  Takes very good care of her feet and sees podiatry      Toe ulcer due to DM [facility])    Almost totally healed- cared for by podiatry Wrapped today        Genitourinary   Renal insufficiency    Cr is up a bit  She has seen nephrology- did hold metformin but she could not tolerate sulfonurea  If this inc again- would consider cutting metformin to 250 bid  Enc better water intake  Also on hctz -this may have to be changed in the future         Other   Anemia    Chronic dz/ from renal insuff  Continue to watch Mild and non symptomatic        HYPERCHOLESTEROLEMIA    Disc goals for lipids and reasons to control them Rev labs with pt Rev low sat fat diet in detail Intol of statins-declines tx       Obesity    Discussed how this problem influences overall health and the risks it imposes  Reviewed plan for weight loss with lower calorie diet (via better food choices and also portion control or program like weight watchers) and exercise building up to or more than 30 minutes 5 days per week including some aerobic activity         Routine general medical examination at a health care facility    Reviewed health habits including diet and exercise and skin cancer prevention Reviewed appropriate screening tests for age  Also reviewed health mt list, fam hx and immunization status , as well as social and family history   See HPI Labs reviewed Pt declines most health mt at this time   I do recommend you go back for routine eye exams - to look for diabetic damage and also monitor the macular degeneration and screen for glaucoma  Get back on vitamin D for bone health over the counter 2000-4000 iu daily  Try some ear drops for wax (debrox) in the right ear once weekly   For cholesterol   Avoid red meat/ fried foods/ egg yolks/ fatty breakfast meats/ butter, cheese and high fat dairy/ and shellfish    Drink more water ! Aim for 64 oz of fluids daily if  possible

## 2016-05-27 NOTE — Patient Instructions (Addendum)
I do recommend you go back for routine eye exams - to look for diabetic damage and also monitor the macular degeneration and screen for glaucoma  Get back on vitamin D for bone health over the counter 2000-4000 iu daily  Try some ear drops for wax (debrox) in the right ear once weekly   For cholesterol   Avoid red meat/ fried foods/ egg yolks/ fatty breakfast meats/ butter, cheese and high fat dairy/ and shellfish    Drink more water ! Aim for 64 oz of fluids daily if possible   Follow up in 6 months for visit with labs prior

## 2016-05-28 NOTE — Assessment & Plan Note (Signed)
Cr is up slightly  Glucose control is good  On losartan hct  Still on metformin

## 2016-05-28 NOTE — Assessment & Plan Note (Signed)
Lab Results  Component Value Date   HGBA1C 5.5 05/13/2016   This is well controlled  Declines eye exam at this time  Enc low glycemic diet / activity as tol and wt loss

## 2016-05-28 NOTE — Assessment & Plan Note (Signed)
Discussed how this problem influences overall health and the risks it imposes  Reviewed plan for weight loss with lower calorie diet (via better food choices and also portion control or program like weight watchers) and exercise building up to or more than 30 minutes 5 days per week including some aerobic activity    

## 2016-05-28 NOTE — Assessment & Plan Note (Signed)
bp in fair control at this time  BP Readings from Last 1 Encounters:  05/27/16 126/64   No changes needed Disc lifstyle change with low sodium diet and exercise  Labs reviewed

## 2016-05-28 NOTE — Assessment & Plan Note (Signed)
Cr is up a bit  She has seen nephrology- did hold metformin but she could not tolerate sulfonurea  If this inc again- would consider cutting metformin to 250 bid  Enc better water intake  Also on hctz -this may have to be changed in the future

## 2016-05-28 NOTE — Assessment & Plan Note (Signed)
Chronic dz/ from renal insuff  Continue to watch Mild and non symptomatic

## 2016-05-28 NOTE — Assessment & Plan Note (Signed)
Almost totally healed- cared for by podiatry Wrapped today

## 2016-05-28 NOTE — Assessment & Plan Note (Signed)
On gabapentin-which helps No clinical changes  Takes very good care of her feet and sees podiatry

## 2016-05-28 NOTE — Assessment & Plan Note (Signed)
Disc goals for lipids and reasons to control them Rev labs with pt Rev low sat fat diet in detail Intol of statins-declines tx

## 2016-05-28 NOTE — Assessment & Plan Note (Signed)
Reviewed health habits including diet and exercise and skin cancer prevention Reviewed appropriate screening tests for age  Also reviewed health mt list, fam hx and immunization status , as well as social and family history   See HPI Labs reviewed Pt declines most health mt at this time   I do recommend you go back for routine eye exams - to look for diabetic damage and also monitor the macular degeneration and screen for glaucoma  Get back on vitamin D for bone health over the counter 2000-4000 iu daily  Try some ear drops for wax (debrox) in the right ear once weekly   For cholesterol   Avoid red meat/ fried foods/ egg yolks/ fatty breakfast meats/ butter, cheese and high fat dairy/ and shellfish    Drink more water ! Aim for 64 oz of fluids daily if possible

## 2016-11-01 ENCOUNTER — Other Ambulatory Visit: Payer: Self-pay | Admitting: Family Medicine

## 2016-11-02 NOTE — Telephone Encounter (Signed)
Refilled as instructed per Dr. Milinda Antis

## 2016-11-02 NOTE — Telephone Encounter (Signed)
Received refill electronically Last office visit 05/27/16 See allergy/contraindication Okay to refill?

## 2016-11-02 NOTE — Telephone Encounter (Signed)
Please refill for a year thanks 

## 2016-11-04 ENCOUNTER — Ambulatory Visit (INDEPENDENT_AMBULATORY_CARE_PROVIDER_SITE_OTHER): Payer: Medicare Other | Admitting: Family Medicine

## 2016-11-04 ENCOUNTER — Encounter: Payer: Self-pay | Admitting: Family Medicine

## 2016-11-04 VITALS — BP 122/60 | HR 93 | Temp 97.7°F | Ht 61.0 in | Wt 175.0 lb

## 2016-11-04 DIAGNOSIS — N289 Disorder of kidney and ureter, unspecified: Secondary | ICD-10-CM | POA: Diagnosis not present

## 2016-11-04 DIAGNOSIS — E6609 Other obesity due to excess calories: Secondary | ICD-10-CM | POA: Diagnosis not present

## 2016-11-04 DIAGNOSIS — E1121 Type 2 diabetes mellitus with diabetic nephropathy: Secondary | ICD-10-CM | POA: Diagnosis not present

## 2016-11-04 DIAGNOSIS — E1142 Type 2 diabetes mellitus with diabetic polyneuropathy: Secondary | ICD-10-CM | POA: Diagnosis not present

## 2016-11-04 DIAGNOSIS — I10 Essential (primary) hypertension: Secondary | ICD-10-CM | POA: Diagnosis not present

## 2016-11-04 DIAGNOSIS — Z6832 Body mass index (BMI) 32.0-32.9, adult: Secondary | ICD-10-CM

## 2016-11-04 DIAGNOSIS — E7800 Pure hypercholesterolemia, unspecified: Secondary | ICD-10-CM

## 2016-11-04 DIAGNOSIS — I8390 Asymptomatic varicose veins of unspecified lower extremity: Secondary | ICD-10-CM | POA: Diagnosis not present

## 2016-11-04 DIAGNOSIS — E1122 Type 2 diabetes mellitus with diabetic chronic kidney disease: Secondary | ICD-10-CM | POA: Diagnosis not present

## 2016-11-04 LAB — COMPREHENSIVE METABOLIC PANEL
ALBUMIN: 4 g/dL (ref 3.5–5.2)
ALT: 8 U/L (ref 0–35)
AST: 13 U/L (ref 0–37)
Alkaline Phosphatase: 67 U/L (ref 39–117)
BILIRUBIN TOTAL: 1 mg/dL (ref 0.2–1.2)
BUN: 20 mg/dL (ref 6–23)
CALCIUM: 9.5 mg/dL (ref 8.4–10.5)
CO2: 27 mEq/L (ref 19–32)
CREATININE: 1.32 mg/dL — AB (ref 0.40–1.20)
Chloride: 102 mEq/L (ref 96–112)
GFR: 40.73 mL/min — ABNORMAL LOW (ref >60.00)
Glucose, Bld: 169 mg/dL — ABNORMAL HIGH (ref 70–99)
Potassium: 3.8 mEq/L (ref 3.5–5.1)
SODIUM: 138 meq/L (ref 135–145)
TOTAL PROTEIN: 7.1 g/dL (ref 6.0–8.3)

## 2016-11-04 LAB — CBC WITH DIFFERENTIAL/PLATELET
Basophils Absolute: 0 10*3/uL (ref 0.0–0.1)
Basophils Relative: 0.8 % (ref 0.0–3.0)
EOS ABS: 0.2 10*3/uL (ref 0.0–0.7)
EOS PCT: 4.4 % (ref 0.0–5.0)
HCT: 34.7 % — ABNORMAL LOW (ref 36.0–46.0)
Hemoglobin: 11.8 g/dL — ABNORMAL LOW (ref 12.0–15.0)
LYMPHS ABS: 1.5 10*3/uL (ref 0.7–4.0)
Lymphocytes Relative: 27.6 % (ref 12.0–46.0)
MCHC: 34.1 g/dL (ref 30.0–36.0)
MCV: 91.9 fl (ref 78.0–100.0)
MONO ABS: 0.5 10*3/uL (ref 0.1–1.0)
Monocytes Relative: 9.3 % (ref 3.0–12.0)
Neutro Abs: 3.1 10*3/uL (ref 1.4–7.7)
Neutrophils Relative %: 57.9 % (ref 43.0–77.0)
Platelets: 164 10*3/uL (ref 150.0–400.0)
RBC: 3.78 Mil/uL — AB (ref 3.87–5.11)
RDW: 14.4 % (ref 11.5–15.5)
WBC: 5.3 10*3/uL (ref 4.0–10.5)

## 2016-11-04 LAB — LIPID PANEL
CHOL/HDL RATIO: 4
Cholesterol: 270 mg/dL — ABNORMAL HIGH (ref 0–200)
HDL: 64.5 mg/dL (ref >39.00)
LDL Cholesterol: 185 mg/dL — ABNORMAL HIGH (ref 0–99)
NONHDL: 205.21
Triglycerides: 101 mg/dL (ref 0.0–149.0)
VLDL: 20.2 mg/dL (ref 0.0–40.0)

## 2016-11-04 LAB — HEMOGLOBIN A1C: HEMOGLOBIN A1C: 5.8 % (ref 4.6–6.5)

## 2016-11-04 MED ORDER — LOSARTAN POTASSIUM-HCTZ 100-12.5 MG PO TABS: 1.0000 | ORAL_TABLET | Freq: Every day | ORAL | 3 refills | Status: DC

## 2016-11-04 MED ORDER — METFORMIN HCL 500 MG PO TABS: 500.0000 mg | ORAL_TABLET | Freq: Two times a day (BID) | ORAL | 3 refills | Status: DC

## 2016-11-04 NOTE — Progress Notes (Signed)
Pre visit review using our clinic review tool, if applicable. No additional management support is needed unless otherwise documented below in the visit note. 

## 2016-11-04 NOTE — Progress Notes (Signed)
Subjective:    Patient ID: Kristina Bennett, female    DOB: Dec 20, 1930, 83 y.o.   MRN: [id number]  HPI Here for f/u of chronic health problems   Feeling ok overall  Nothing new going on    Wt Readings from Last 3 Encounters:  11/04/16 175 lb (79.4 kg)  05/27/16 174 lb 8 oz (79.2 kg)  05/13/16 172 lb 4 oz (78.1 kg)  overall stable  Eating well / not a diabetic diet (but her son cooks for her and fixes low sugar options) No exercise at all- she dislikes it and uses a walker  Has to be very careful not to fall /has a gravel driveway  Not a lot of outdoor time  bmi 33.0  bp is stable today  No cp or palpitations or headaches or edema  No side effects to medicines  BP Readings from Last 3 Encounters:  11/04/16 (!) 142/72  05/27/16 126/64  05/13/16 (!) 142/80    Improved on 2nd check BP: 122/60    DM2 with obesity and neuropathy and nephropathy Lab Results  Component Value Date   HGBA1C 5.5 05/13/2016  she suspects this will be stable  No low blood glucose readings / does not check it often  occ eats sweets  She refuses to watch her diet at her age  Also refuses to go to the eye doctor  Thinks her vision is ok -still reading and does word puzzles  Toe ulcer is much improved/ chronic but not worsening    Due for labs for this and kidney as well   Lab Results  Component Value Date   CREATININE 1.27 (H) 05/13/2016   BUN 21 05/13/2016   NA 138 05/13/2016   K 4.2 05/13/2016   CL 102 05/13/2016   CO2 28 05/13/2016    inst to inc her water intake last time  She is mindful of water - 2 bottles per day   Due for cholesterol  Lab Results  Component Value Date   CHOL 254 (H) 05/13/2016   HDL 55.60 05/13/2016   LDLCALC 173 (H) 05/13/2016   LDLDIRECT 144.0 10/31/2014   TRIG 126.0 05/13/2016   CHOLHDL 5 05/13/2016    Has seen renal  Still on metformin  Did not tolerate sulfonurea  Patient Active Problem List   Diagnosis Date Noted  . Obesity 11/05/2015  .  Routine general medical examination at a health care facility 05/07/2015  . Encounter for Medicare annual wellness exam 05/01/2014  . Pre-ulcerative calluses 11/01/2013  . Toe ulcer due to DM ([place]) 03/29/2013  . EDEMA 11/14/2009  . Renal insufficiency 09/24/2009  . VARICOSE VEINS, LOWER EXTREMITIES 12/05/2008  . Anemia 07/05/2007  . Diabetes type 2, controlled ([place]) 04/08/2007  . Diabetic nephropathy ([place]) 04/08/2007  . Diabetic neuropathy ([place]) 04/08/2007  . HYPERCHOLESTEROLEMIA 04/08/2007  . MACULAR DEGENERATION 04/08/2007  . Essential hypertension 04/08/2007  . RHEUMATOID ARTHRITIS 04/08/2007  . ADHESIVE CAPSULITIS, BILATERAL 04/08/2007  . URINARY INCONTINENCE 04/08/2007  . NEPHROLITHIASIS, HX OF 04/08/2007   Past Medical History:  Diagnosis Date  . Anemia   . Anemia   . Diabetes mellitus   . DM2 (diabetes mellitus, type 2) ([place])   . HTN (hypertension)   . Kidney congenitally absent, left    small  . Macular degeneration   . Macular degeneration   . Nephrolithiasis    hx  . Renal insufficiency    poss renal vasc dz  . Renal insufficiency   . Rheumatoid arthritis(714.0)   .  Rheumatoid arthritis(714.0)   . Umbilical hernia   . Urinary incontinence    Past Surgical History:  Procedure Laterality Date  . 411 echo     mild diastolic dysfunction EF 44%, slt pulm HTN   . cataract signs     Social History  Substance Use Topics  . Smoking status: Never Smoker  . Smokeless tobacco: Never Used  . Alcohol use No   Family History  Problem Relation Age of Onset  . Heart attack Mother   . Prostate cancer Father    Allergies  Allergen Reactions  . Ace Inhibitors     REACTION: ? reaction  . Atorvastatin     REACTION: ? reaction  . Etodolac     REACTION: swelling and dizziness  . Glipizide     REACTION: low sugar  . Metformin     REACTION: inc cr  . Metoprolol Tartrate     REACTION: ? reaction  . Propoxyphene N-Acetaminophen     REACTION: ? reaction  .  Rosuvastatin     REACTION: ? reaction  . Tolterodine Tartrate     REACTION: ? reaction   Current Outpatient Prescriptions on File Prior to Visit  Medication Sig Dispense Refill  . Blood Glucose Monitoring Suppl (CONTOUR BLOOD GLUCOSE SYSTEM) W/DEVICE KIT Use to check blood sugar once daily     . gabapentin (NEURONTIN) 300 MG capsule TAKE 1 CAPSULES TWICE A DAY 180 capsule 3  . glucose blood (BAYER CONTOUR TEST) test strip 1 each by Other route daily. Use as instructed     . mupirocin ointment (BACTROBAN) 2 % Apply 1 application topically 2 (two) times daily. To affected area on foot 22 g 3  . silver sulfADIAZINE (SILVADENE) 1 % cream Apply 1 application topically daily. 50 g 0  . tolterodine (DETROL LA) 4 MG 24 hr capsule TAKE 1 CAPSULE DAILY 90 capsule 3   No current facility-administered medications on file prior to visit.       Review of Systems Review of Systems  Constitutional: Negative for fever, appetite change, and unexpected weight change.  Eyes: Negative for pain and visual disturbance.  Respiratory: Negative for cough and shortness of breath.   Cardiovascular: Negative for cp or palpitations    Gastrointestinal: Negative for nausea, diarrhea and constipation.  Genitourinary: Negative for urgency and frequency.  Skin: Negative for pallor or rash   Neurological: Negative for weakness, light-headedness, numbness and headaches. pos for poor balance  Hematological: Negative for adenopathy. Does not bruise/bleed easily.  Psychiatric/Behavioral: Negative for dysphoric mood. The patient is not nervous/anxious.         Objective:   Physical Exam  Constitutional: She appears well-developed and well-nourished. No distress.  obese and well appearing   HENT:  Head: Normocephalic and atraumatic.  Mouth/Throat: Oropharynx is clear and moist.  Eyes: Conjunctivae and EOM are normal. Pupils are equal, round, and reactive to light. Right eye exhibits no discharge. Left eye exhibits no  discharge. No scleral icterus.  Neck: Normal range of motion. Neck supple. No JVD present. Carotid bruit is not present. No thyromegaly present.  Cardiovascular: Normal rate, regular rhythm, normal heart sounds and intact distal pulses.  Exam reveals no gallop.   Pulmonary/Chest: Effort normal and breath sounds normal. No respiratory distress. She has no wheezes. She has no rales.  No crackles  Abdominal: Soft. Bowel sounds are normal. She exhibits no distension, no abdominal bruit and no mass. There is no tenderness.  Musculoskeletal: She exhibits no edema or  tenderness.  Foot exam not done today  Lymphadenopathy:    She has no cervical adenopathy.  Neurological: She is alert. She has normal reflexes. No cranial nerve deficit. She exhibits normal muscle tone. Coordination normal.  Skin: Skin is warm and dry. No rash noted. No pallor.  Psychiatric: She has a normal mood and affect.  Timid and quiet  Daughter does some talking for her           Assessment & Plan:   Problem List Items Addressed This Visit      Cardiovascular and Mediastinum   Essential hypertension - Primary    bp in fair control at this time  BP Readings from Last 1 Encounters:  11/04/16 122/60   No changes needed Disc lifstyle change with low sodium diet and exercise  Labs today      Relevant Medications   losartan-hydrochlorothiazide (HYZAAR) 100-12.5 MG tablet   Other Relevant Orders   Lipid panel (Completed)   CBC with Differential/Platelet (Completed)   Comprehensive metabolic panel (Completed)     Endocrine   Diabetes type 2, controlled ([place])    Due for A1C Pt does not check glucose often-no hypoglycemia episodes Refuses to watch her diet or exercise at her age Refuses eye exams  Chronic toe ulcer improved        Relevant Medications   metFORMIN (GLUCOPHAGE) 500 MG tablet   losartan-hydrochlorothiazide (HYZAAR) 100-12.5 MG tablet   Diabetic nephropathy ([facility])    Labs today  On ARB         Relevant Medications   metFORMIN (GLUCOPHAGE) 500 MG tablet   losartan-hydrochlorothiazide (HYZAAR) 100-12.5 MG tablet   Other Relevant Orders   Hemoglobin A1c (Completed)   Diabetic neuropathy ([facility])    No change in status  Continues gabapentin       Relevant Medications   metFORMIN (GLUCOPHAGE) 500 MG tablet   losartan-hydrochlorothiazide (HYZAAR) 100-12.5 MG tablet     Genitourinary   Renal insufficiency    Lab today  Enc more water (not close to her goal for 64 oz of fluids daily) On metformin and also arb-hct  She wants to continue metformin since she is unable to tolerate sulfonurea Has seen renal in the past        Relevant Orders   Comprehensive metabolic panel (Completed)     Other   HYPERCHOLESTEROLEMIA    Disc goals for lipids and reasons to control them Rev labs with pt Rev low sat fat diet in detail  Intol of statins and zetia- declines any treatment at all  Not motivated to change diet Continue to monitor       Relevant Medications   losartan-hydrochlorothiazide (HYZAAR) 100-12.5 MG tablet   Other Relevant Orders   Lipid panel (Completed)   Obesity    Discussed how this problem influences overall health and the risks it imposes  Reviewed plan for weight loss with lower calorie diet (via better food choices and also portion control or program like weight watchers) and exercise building up to or more than 30 minutes 5 days per week including some aerobic activity   Pt is not motivated to make changes       Relevant Medications   metFORMIN (GLUCOPHAGE) 500 MG tablet

## 2016-11-04 NOTE — Patient Instructions (Addendum)
Please make an effort to get 64 oz of fluids a day (mostly water)  Labs today   Try to eat less sugar and stay as active as you can be  Get outdoors a bit I would like you to see the eye doctor yearly   Follow up for annual exam in 6 mo

## 2016-11-05 NOTE — Assessment & Plan Note (Signed)
No changes

## 2016-11-05 NOTE — Assessment & Plan Note (Addendum)
Lab today  Enc more water (not close to her goal for 64 oz of fluids daily) On metformin and also arb-hct  She wants to continue metformin since she is unable to tolerate sulfonurea Has seen renal in the past

## 2016-11-05 NOTE — Assessment & Plan Note (Signed)
Discussed how this problem influences overall health and the risks it imposes  Reviewed plan for weight loss with lower calorie diet (via better food choices and also portion control or program like weight watchers) and exercise building up to or more than 30 minutes 5 days per week including some aerobic activity   Pt is not motivated to make changes

## 2016-11-05 NOTE — Assessment & Plan Note (Signed)
No change in status  Continues gabapentin

## 2016-11-05 NOTE — Assessment & Plan Note (Signed)
bp in fair control at this time  BP Readings from Last 1 Encounters:  11/04/16 122/60   No changes needed Disc lifstyle change with low sodium diet and exercise  Labs today

## 2016-11-05 NOTE — Assessment & Plan Note (Signed)
Labs today  On ARB

## 2016-11-05 NOTE — Assessment & Plan Note (Signed)
Due for A1C Pt does not check glucose often-no hypoglycemia episodes Refuses to watch her diet or exercise at her age Refuses eye exams  Chronic toe ulcer improved

## 2016-11-05 NOTE — Assessment & Plan Note (Addendum)
Disc goals for lipids and reasons to control them Rev labs with pt Rev low sat fat diet in detail  Intol of statins and zetia- declines any treatment at all  Not motivated to change diet Continue to monitor

## 2016-11-09 ENCOUNTER — Encounter: Payer: Self-pay | Admitting: Family Medicine

## 2016-11-09 MED ORDER — EZETIMIBE 10 MG PO TABS: 10.0000 mg | ORAL_TABLET | Freq: Every day | ORAL | 3 refills | Status: DC

## 2016-11-11 ENCOUNTER — Other Ambulatory Visit: Payer: Self-pay | Admitting: Family Medicine

## 2016-11-12 NOTE — Telephone Encounter (Signed)
Please refill for a year  

## 2016-11-12 NOTE — Telephone Encounter (Signed)
done

## 2016-11-12 NOTE — Telephone Encounter (Signed)
CPE scheduled for 05/28/17, last filled on 11/05/15 #180 caps with 3 additional refills, please advise

## 2017-01-17 LAB — HM DIABETES FOOT EXAM

## 2017-03-10 ENCOUNTER — Ambulatory Visit (INDEPENDENT_AMBULATORY_CARE_PROVIDER_SITE_OTHER): Payer: Medicare Other | Admitting: Family Medicine

## 2017-03-10 VITALS — BP 128/68 | HR 91 | Temp 97.5°F | Ht 61.0 in | Wt 175.5 lb

## 2017-03-10 DIAGNOSIS — I8390 Asymptomatic varicose veins of unspecified lower extremity: Secondary | ICD-10-CM

## 2017-03-10 DIAGNOSIS — N289 Disorder of kidney and ureter, unspecified: Secondary | ICD-10-CM | POA: Diagnosis not present

## 2017-03-10 DIAGNOSIS — I10 Essential (primary) hypertension: Secondary | ICD-10-CM

## 2017-03-10 DIAGNOSIS — R600 Localized edema: Secondary | ICD-10-CM | POA: Diagnosis not present

## 2017-03-10 NOTE — Progress Notes (Signed)
Subjective:    Patient ID: Noni Saupe, female    DOB: [birth year], 83 y.o.   MRN: [id number]  HPI Here for several medical issues including swollen feet/ankles  Worse in the past 2 weeks  It has not been this bad before  Has vein issues  She does tend to sit with feet down much of the day (she raises them on an ottoman shorter than the chair)  Some pain over top of feet / tightness   Has a chronic toe ulcer (R 3rd toe) - is on keflex and followed by podiatry for that   She does not wear support stockings if helpful  All swelling is below the knee  No new sob on exertion or chest pain   No nsaids   More salt in diet  She does salt things since her jaw surgery since her taste decreased   She has a hx of varicose veins   Wt Readings from Last 3 Encounters:  03/10/17 175 lb 8 oz (79.6 kg)  11/04/16 175 lb (79.4 kg)  05/27/16 174 lb 8 oz (79.2 kg)   33.16 kg/m  bp is stable today  No cp or palpitations or headaches or edema  No side effects to medicines  BP Readings from Last 3 Encounters:  03/10/17 128/68  11/04/16 122/60  05/27/16 126/64    Takes hyzaar 100-12.5   Lab Results  Component Value Date   CREATININE 1.32 (H) 11/04/2016   BUN 20 11/04/2016   NA 138 11/04/2016   K 3.8 11/04/2016   CL 102 11/04/2016   CO2 27 11/04/2016      Has DM with nephropathy and neuropathy Lab Results  Component Value Date   HGBA1C 5.8 11/04/2016   Very well controlled  Cut out her metformin completely- did not need it   Takes gabapentin 300 usually qhs , occ bid   Patient Active Problem List   Diagnosis Date Noted  . Obesity 11/05/2015  . Routine general medical examination at a health care facility 05/07/2015  . Encounter for Medicare annual wellness exam 05/01/2014  . Pre-ulcerative calluses 11/01/2013  . Toe ulcer due to DM ([place]) 03/29/2013  . Pedal edema 11/14/2009  . Renal insufficiency 09/24/2009  . VARICOSE VEINS, LOWER EXTREMITIES 12/05/2008  .  Anemia 07/05/2007  . Diabetes type 2, controlled ([place]) 04/08/2007  . Diabetic nephropathy ([place]) 04/08/2007  . Diabetic neuropathy ([place]) 04/08/2007  . HYPERCHOLESTEROLEMIA 04/08/2007  . MACULAR DEGENERATION 04/08/2007  . Essential hypertension 04/08/2007  . ADHESIVE CAPSULITIS, BILATERAL 04/08/2007  . URINARY INCONTINENCE 04/08/2007  . NEPHROLITHIASIS, HX OF 04/08/2007   Past Medical History:  Diagnosis Date  . Anemia   . Anemia   . Diabetes mellitus   . DM2 (diabetes mellitus, type 2) ([place])   . HTN (hypertension)   . Kidney congenitally absent, left    small  . Macular degeneration   . Macular degeneration   . Nephrolithiasis    hx  . Renal insufficiency    poss renal vasc dz  . Renal insufficiency   . Rheumatoid arthritis(714.0)   . Rheumatoid arthritis(714.0)   . Umbilical hernia   . Urinary incontinence    Past Surgical History:  Procedure Laterality Date  . 411 echo     mild diastolic dysfunction EF 81%, slt pulm HTN   . cataract signs     Social History  Substance Use Topics  . Smoking status: Never Smoker  . Smokeless tobacco: Never Used  . Alcohol use  No   Family History  Problem Relation Age of Onset  . Heart attack Mother   . Prostate cancer Father    Allergies  Allergen Reactions  . Ace Inhibitors     REACTION: ? reaction  . Atorvastatin     REACTION: ? reaction  . Etodolac     REACTION: swelling and dizziness  . Glipizide     REACTION: low sugar  . Metformin     REACTION: inc cr  . Metoprolol Tartrate     REACTION: ? reaction  . Propoxyphene N-Acetaminophen     REACTION: ? reaction  . Rosuvastatin     REACTION: ? reaction  . Tolterodine Tartrate     REACTION: ? reaction   Current Outpatient Prescriptions on File Prior to Visit  Medication Sig Dispense Refill  . Blood Glucose Monitoring Suppl (CONTOUR BLOOD GLUCOSE SYSTEM) W/DEVICE KIT Use to check blood sugar once daily     . ezetimibe (ZETIA) 10 MG tablet Take 1 tablet (10 mg total)  by mouth daily. 90 tablet 3  . gabapentin (NEURONTIN) 300 MG capsule TAKE 1 CAPSULE TWICE A DAY 180 capsule 3  . glucose blood (BAYER CONTOUR TEST) test strip 1 each by Other route daily. Use as instructed     . losartan-hydrochlorothiazide (HYZAAR) 100-12.5 MG tablet Take 1 tablet by mouth daily. 90 tablet 3  . mupirocin ointment (BACTROBAN) 2 % Apply 1 application topically 2 (two) times daily. To affected area on foot 22 g 3  . silver sulfADIAZINE (SILVADENE) 1 % cream Apply 1 application topically daily. 50 g 0  . tolterodine (DETROL LA) 4 MG 24 hr capsule TAKE 1 CAPSULE DAILY 90 capsule 3   No current facility-administered medications on file prior to visit.      Review of Systems Review of Systems  Constitutional: Negative for fever, appetite change,  and unexpected weight change.  Eyes: Negative for pain and pos for visual disturbance. (macular deg) Respiratory: Negative for cough and shortness of breath.   Cardiovascular: Negative for cp or palpitations   neg for pnd/orthopnea or sob on exertion pos for pedal edema  Gastrointestinal: Negative for nausea, diarrhea and constipation.  Genitourinary: Negative for urgency and frequency.  Skin: Negative for pallor or rash   Neurological: Negative for weakness, light-headedness, numbness and headaches.  Hematological: Negative for adenopathy. Does not bruise/bleed easily.  Psychiatric/Behavioral: Negative for dysphoric mood. The patient is sometimes nervous/anxious.         Objective:   Physical Exam  Constitutional: She appears well-developed and well-nourished. No distress.  obese and well appearing   HENT:  Head: Normocephalic and atraumatic.  Mouth/Throat: Oropharynx is clear and moist.  Eyes: Pupils are equal, round, and reactive to light. Conjunctivae and EOM are normal.  Neck: Normal range of motion. Neck supple. No JVD present. Carotid bruit is not present. No thyromegaly present.  Cardiovascular: Normal rate, regular  rhythm, normal heart sounds and intact distal pulses.  Exam reveals no gallop.   Pulmonary/Chest: Effort normal and breath sounds normal. No respiratory distress. She has no wheezes. She has no rales.  No crackles  Abdominal: Soft. Bowel sounds are normal. She exhibits no distension, no abdominal bruit and no mass. There is no tenderness.  Musculoskeletal: She exhibits edema. She exhibits no tenderness.  Pedal edema tr to one plus worse on the L (half way to the knee) Varicosities noted esp in ankles  No tenderness   Lymphadenopathy:    She has no cervical adenopathy.  Neurological: She is alert. She has normal reflexes. No cranial nerve deficit. She exhibits normal muscle tone. Coordination normal.  Skin: Skin is warm and dry. No rash noted. No pallor.  Psychiatric: She has a normal mood and affect.          Assessment & Plan:   Problem List Items Addressed This Visit      Cardiovascular and Mediastinum   Essential hypertension - Primary    bp in fair control at this time  BP Readings from Last 1 Encounters:  03/10/17 128/68   No changes needed Disc lifstyle change with low sodium diet and exercise  More pedal edema lately Takes losartan hct Lab today       Relevant Orders   Comprehensive metabolic panel   VARICOSE VEINS, LOWER EXTREMITIES    May be adding to pedal edema  Disc elevation of legs Also support socks otc to knee (can write px for them if well tolerated)        Genitourinary   Renal insufficiency    More pedal edema Lab today  Enc inc water intake and less sodium         Other   Pedal edema    Worse lately  May be multifactorial- inc sodium, less water, more sitting with dependent legs, varicose veins, obesity and heat  Also on gabapentin  No cardiac red flags Lab today  May be interested in furosemide if worse or no improvement      Relevant Orders   Comprehensive metabolic panel   TSH   Brain natriuretic peptide

## 2017-03-10 NOTE — Patient Instructions (Signed)
I think your swelling is muti factorial from too much sodium/ not enough water and sitting with feet down   We will do some baseline labs If you develop shortness of breath or chest pain let us know   Elevate feet whenever you sit Get support stockings to the knee and wear while awake   Try to drink more water Avoid sodium (processed foods and salt)- see the handout on DASH eating plan   If not improved we could consider lasix (furosemide)   Labs today

## 2017-03-11 LAB — BRAIN NATRIURETIC PEPTIDE: Pro B Natriuretic peptide (BNP): 30 pg/mL (ref 0.0–100.0)

## 2017-03-11 LAB — COMPREHENSIVE METABOLIC PANEL
ALBUMIN: 3.5 g/dL (ref 3.5–5.2)
ALT: 7 U/L (ref 0–35)
AST: 14 U/L (ref 0–37)
Alkaline Phosphatase: 62 U/L (ref 39–117)
BILIRUBIN TOTAL: 0.7 mg/dL (ref 0.2–1.2)
BUN: 36 mg/dL — ABNORMAL HIGH (ref 6–23)
CALCIUM: 9.3 mg/dL (ref 8.4–10.5)
CO2: 26 mEq/L (ref 19–32)
Chloride: 106 mEq/L (ref 96–112)
Creatinine, Ser: 1.26 mg/dL — ABNORMAL HIGH (ref 0.40–1.20)
GFR: 42.94 mL/min — AB (ref >60.00)
Glucose, Bld: 184 mg/dL — ABNORMAL HIGH (ref 70–99)
POTASSIUM: 3.7 meq/L (ref 3.5–5.1)
Sodium: 141 mEq/L (ref 135–145)
TOTAL PROTEIN: 6.5 g/dL (ref 6.0–8.3)

## 2017-03-11 LAB — TSH: TSH: 0.84 u[IU]/mL (ref 0.35–4.50)

## 2017-03-11 NOTE — Assessment & Plan Note (Signed)
Worse lately  May be multifactorial- inc sodium, less water, more sitting with dependent legs, varicose veins, obesity and heat  Also on gabapentin  No cardiac red flags Lab today  May be interested in furosemide if worse or no improvement

## 2017-03-11 NOTE — Assessment & Plan Note (Signed)
May be adding to pedal edema  Disc elevation of legs Also support socks otc to knee (can write px for them if well tolerated)

## 2017-03-11 NOTE — Assessment & Plan Note (Signed)
More pedal edema Lab today  Enc inc water intake and less sodium

## 2017-03-11 NOTE — Assessment & Plan Note (Signed)
bp in fair control at this time  BP Readings from Last 1 Encounters:  03/10/17 128/68   No changes needed Disc lifstyle change with low sodium diet and exercise  More pedal edema lately Takes losartan hct Lab today

## 2017-04-26 ENCOUNTER — Telehealth: Payer: Self-pay | Admitting: Family Medicine

## 2017-04-26 NOTE — Telephone Encounter (Signed)
LVM for pt to call Revonda Standard back at [phone] or [facility] office to reschedule AWV with Virl Axe and CPE with Dr. Milinda Antis due to both being out of office.

## 2017-04-26 NOTE — Telephone Encounter (Signed)
Rescheduled to 06/22/17. Pt daughter aware.

## 2017-05-18 ENCOUNTER — Ambulatory Visit (INDEPENDENT_AMBULATORY_CARE_PROVIDER_SITE_OTHER): Payer: Medicare Other

## 2017-05-18 DIAGNOSIS — Z23 Encounter for immunization: Secondary | ICD-10-CM

## 2017-05-28 ENCOUNTER — Encounter: Payer: Medicare Other | Admitting: Family Medicine

## 2017-05-28 ENCOUNTER — Ambulatory Visit: Payer: Medicare Other

## 2017-06-20 ENCOUNTER — Telehealth: Payer: Self-pay | Admitting: Family Medicine

## 2017-06-20 DIAGNOSIS — E7800 Pure hypercholesterolemia, unspecified: Secondary | ICD-10-CM

## 2017-06-20 DIAGNOSIS — I10 Essential (primary) hypertension: Secondary | ICD-10-CM

## 2017-06-20 DIAGNOSIS — E1122 Type 2 diabetes mellitus with diabetic chronic kidney disease: Secondary | ICD-10-CM

## 2017-06-20 NOTE — Telephone Encounter (Signed)
-----   Message from Robert Bellow, LPN sent at [id number]  3:11 PM EST ----- Regarding: Labs 12/3 Lab orders needed. Thank you.  Insurance:  [facility]

## 2017-06-22 ENCOUNTER — Ambulatory Visit: Payer: Medicare Other

## 2017-06-22 ENCOUNTER — Encounter: Payer: Self-pay | Admitting: Family Medicine

## 2017-06-22 ENCOUNTER — Ambulatory Visit (INDEPENDENT_AMBULATORY_CARE_PROVIDER_SITE_OTHER): Payer: Medicare Other | Admitting: Family Medicine

## 2017-06-22 VITALS — BP 130/66 | HR 76 | Temp 98.1°F | Wt 172.5 lb

## 2017-06-22 DIAGNOSIS — L84 Corns and callosities: Secondary | ICD-10-CM

## 2017-06-22 DIAGNOSIS — E11621 Type 2 diabetes mellitus with foot ulcer: Secondary | ICD-10-CM | POA: Diagnosis not present

## 2017-06-22 DIAGNOSIS — E1122 Type 2 diabetes mellitus with diabetic chronic kidney disease: Secondary | ICD-10-CM

## 2017-06-22 DIAGNOSIS — I10 Essential (primary) hypertension: Secondary | ICD-10-CM | POA: Diagnosis not present

## 2017-06-22 DIAGNOSIS — E1121 Type 2 diabetes mellitus with diabetic nephropathy: Secondary | ICD-10-CM

## 2017-06-22 DIAGNOSIS — Z6832 Body mass index (BMI) 32.0-32.9, adult: Secondary | ICD-10-CM

## 2017-06-22 DIAGNOSIS — L97519 Non-pressure chronic ulcer of other part of right foot with unspecified severity: Secondary | ICD-10-CM

## 2017-06-22 DIAGNOSIS — H3530 Unspecified macular degeneration: Secondary | ICD-10-CM

## 2017-06-22 DIAGNOSIS — N289 Disorder of kidney and ureter, unspecified: Secondary | ICD-10-CM

## 2017-06-22 DIAGNOSIS — E6609 Other obesity due to excess calories: Secondary | ICD-10-CM | POA: Diagnosis not present

## 2017-06-22 DIAGNOSIS — Z0000 Encounter for general adult medical examination without abnormal findings: Secondary | ICD-10-CM

## 2017-06-22 DIAGNOSIS — D649 Anemia, unspecified: Secondary | ICD-10-CM

## 2017-06-22 DIAGNOSIS — E7800 Pure hypercholesterolemia, unspecified: Secondary | ICD-10-CM | POA: Diagnosis not present

## 2017-06-22 DIAGNOSIS — E1142 Type 2 diabetes mellitus with diabetic polyneuropathy: Secondary | ICD-10-CM

## 2017-06-22 LAB — LIPID PANEL
Cholesterol: 195 mg/dL (ref 0–200)
HDL: 52.6 mg/dL (ref >39.00)
LDL CALC: 125 mg/dL — AB (ref 0–99)
NONHDL: 142.89
Total CHOL/HDL Ratio: 4
Triglycerides: 87 mg/dL (ref 0.0–149.0)
VLDL: 17.4 mg/dL (ref 0.0–40.0)

## 2017-06-22 LAB — COMPREHENSIVE METABOLIC PANEL
ALBUMIN: 4 g/dL (ref 3.5–5.2)
ALT: 6 U/L (ref 0–35)
AST: 14 U/L (ref 0–37)
Alkaline Phosphatase: 70 U/L (ref 39–117)
BUN: 18 mg/dL (ref 6–23)
CHLORIDE: 105 meq/L (ref 96–112)
CO2: 29 mEq/L (ref 19–32)
CREATININE: 1.27 mg/dL — AB (ref 0.40–1.20)
Calcium: 9.4 mg/dL (ref 8.4–10.5)
GFR: 42.52 mL/min — ABNORMAL LOW (ref >60.00)
GLUCOSE: 119 mg/dL — AB (ref 70–99)
POTASSIUM: 4.9 meq/L (ref 3.5–5.1)
Sodium: 141 mEq/L (ref 135–145)
Total Bilirubin: 0.9 mg/dL (ref 0.2–1.2)
Total Protein: 7 g/dL (ref 6.0–8.3)

## 2017-06-22 LAB — CBC WITH DIFFERENTIAL/PLATELET
BASOS PCT: 0.7 % (ref 0.0–3.0)
Basophils Absolute: 0 10*3/uL (ref 0.0–0.1)
Eosinophils Absolute: 0.2 10*3/uL (ref 0.0–0.7)
Eosinophils Relative: 4.5 % (ref 0.0–5.0)
HCT: 33.6 % — ABNORMAL LOW (ref 36.0–46.0)
Hemoglobin: 11.4 g/dL — ABNORMAL LOW (ref 12.0–15.0)
LYMPHS ABS: 1.3 10*3/uL (ref 0.7–4.0)
Lymphocytes Relative: 25.9 % (ref 12.0–46.0)
MCHC: 33.8 g/dL (ref 30.0–36.0)
MCV: 93.3 fl (ref 78.0–100.0)
MONO ABS: 0.6 10*3/uL (ref 0.1–1.0)
Monocytes Relative: 12.5 % — ABNORMAL HIGH (ref 3.0–12.0)
NEUTROS ABS: 2.8 10*3/uL (ref 1.4–7.7)
NEUTROS PCT: 56.4 % (ref 43.0–77.0)
PLATELETS: 152 10*3/uL (ref 150.0–400.0)
RBC: 3.61 Mil/uL — ABNORMAL LOW (ref 3.87–5.11)
RDW: 13.6 % (ref 11.5–15.5)
WBC: 5 10*3/uL (ref 4.0–10.5)

## 2017-06-22 LAB — TSH: TSH: 0.88 u[IU]/mL (ref 0.35–4.50)

## 2017-06-22 LAB — HEMOGLOBIN A1C: Hgb A1c MFr Bld: 5.9 % (ref 4.6–6.5)

## 2017-06-22 NOTE — Patient Instructions (Addendum)
Try to get 1200-1500 mg of calcium per day with at least 1000 iu of vitamin D - for bone health (the D is most important)   I recommend the shingrix vaccine if you can get it  Check in with your local pharmacy about getting   Labs today   Take care of yourself   For cholesterol :   Avoid red meat/ fried foods/ egg yolks/ fatty breakfast meats/ butter, cheese and high fat dairy/ and shellfish    Stay as active as you can be   Continue podiatry follow up appointments

## 2017-06-22 NOTE — Progress Notes (Signed)
Subjective:    Patient ID: Kristina Bennett, female    DOB: 1931/03/11, 83 y.o.   MRN: [id number]  HPI I have personally reviewed the Medicare Annual Wellness questionnaire and have noted 1. The patient's medical and social history 2. Their use of alcohol, tobacco or illicit drugs 3. Their current medications and supplements 4. The patient's functional ability including ADL's, fall risks, home safety risks and hearing or visual             impairment. 5. Diet and physical activities 6. Evidence for depression or mood disorders  The patients weight, height, BMI have been recorded in the chart and visual acuity is per eye clinic.  I have made referrals, counseling and provided education to the patient based review of the above and I have provided the pt with a written personalized care plan for preventive services. Reviewed and updated provider list, see scanned forms.  See scanned forms.  Routine anticipatory guidance given to patient.  See health maintenance. Colon cancer screening -not interested  Eye exam -overdue / does not want to go / declines -has mac degeneration and is not worried about DM damage  Breast cancer screening mammogram 2002- declines  Self breast exam-no lumps or concerns  dexa - declines  No falls or fractures , not taking ca or D (does drink milk)  Flu vaccine 10/18 Tetanus vaccine 11/12 Pneumovax completed Zoster vaccine -may be interested in shingrix if affordable   Advance directive-has living will and POA  Cognitive function addressed- see scanned forms- and if abnormal then additional documentation follows.  Short term memory is a little worse than it used to be as expected  No concerns for alz  No accidents or problems  occ misplaces something  Depression screen is 0 today Mood is good !   PMH and SH reviewed  Meds, vitals, and allergies reviewed.   ROS: See HPI.  Otherwise negative.    Wt Readings from Last 3 Encounters:  06/22/17 172 lb 8  oz (78.2 kg)  03/10/17 175 lb 8 oz (79.6 kg)  11/04/16 175 lb (79.4 kg)  down 3 lb - not really eating any differently  Appetite is good  Her son does the cooking  Not a lot of exercise (toes and feet hurt /neuropathy)  32.59 kg/m   One of her foot ulcers is healing and one is healed Better overall  Last pod visit was aug-had her dm foot exam (one abx good)  Wearing DM shoes- taking good care of her feet   Last labs 8/18 Office Visit on 03/10/2017  Component Date Value Ref Range Status  . Sodium 03/10/2017 141  135 - 145 mEq/L Final  . Potassium 03/10/2017 3.7  3.5 - 5.1 mEq/L Final  . Chloride 03/10/2017 106  96 - 112 mEq/L Final  . CO2 03/10/2017 26  19 - 32 mEq/L Final  . Glucose, Bld 03/10/2017 184* 70 - 99 mg/dL Final  . BUN 03/10/2017 36* 6 - 23 mg/dL Final  . Creatinine, Ser 03/10/2017 1.26* 0.40 - 1.20 mg/dL Final  . Total Bilirubin 03/10/2017 0.7  0.2 - 1.2 mg/dL Final  . Alkaline Phosphatase 03/10/2017 62  39 - 117 U/L Final  . AST 03/10/2017 14  0 - 37 U/L Final  . ALT 03/10/2017 7  0 - 35 U/L Final  . Total Protein 03/10/2017 6.5  6.0 - 8.3 g/dL Final  . Albumin 03/10/2017 3.5  3.5 - 5.2 g/dL Final  . Calcium 03/10/2017  9.3  8.4 - 10.5 mg/dL Final  . GFR 03/10/2017 42.94* >60.00 mL/min Final  . TSH 03/10/2017 0.84  0.35 - 4.50 uIU/mL Final  . Pro B Natriuretic peptide (BNP) 03/10/2017 30.0  0.0 - 100.0 pg/mL Final    Is drinking more water for kidney health -doing better with that  Improved  Cr Lab Results  Component Value Date   HGBA1C 5.8 11/04/2016   Due for check today    bp is stable today  No cp or palpitations or headaches or edema  No side effects to medicines  BP Readings from Last 3 Encounters:  06/22/17 130/66  03/10/17 128/68  11/04/16 122/60     DM2 Lab Results  Component Value Date   HGBA1C 5.8 11/04/2016  no longer taking metformin /hopes it will not be up much  Eye exam -declines  Sees podiatrist (7/18)   Hyperlipidemia Lab  Results  Component Value Date   CHOL 270 (H) 11/04/2016   HDL 64.50 11/04/2016   LDLCALC 185 (H) 11/04/2016   LDLDIRECT 144.0 10/31/2014   TRIG 101.0 11/04/2016   CHOLHDL 4 11/04/2016   Intol of statins  Now is tolerating zetia well - will check today  Declines further tx  Red meat 2 times per month max Fried food - less than once per month Alfredo sauce occasionally  occ bacon/sausage    Patient Active Problem List   Diagnosis Date Noted  . Obesity 11/05/2015  . Routine general medical examination at a health care facility 05/07/2015  . Encounter for Medicare annual wellness exam 05/01/2014  . Pre-ulcerative calluses 11/01/2013  . Toe ulcer due to DM ([place]) 03/29/2013  . Pedal edema 11/14/2009  . Renal insufficiency 09/24/2009  . VARICOSE VEINS, LOWER EXTREMITIES 12/05/2008  . Anemia 07/05/2007  . Diabetes type 2, controlled ([place]) 04/08/2007  . Diabetic nephropathy ([place]) 04/08/2007  . Diabetic neuropathy ([place]) 04/08/2007  . HYPERCHOLESTEROLEMIA 04/08/2007  . MACULAR DEGENERATION 04/08/2007  . Essential hypertension 04/08/2007  . ADHESIVE CAPSULITIS, BILATERAL 04/08/2007  . URINARY INCONTINENCE 04/08/2007  . NEPHROLITHIASIS, HX OF 04/08/2007   Past Medical History:  Diagnosis Date  . Anemia   . Anemia   . Diabetes mellitus   . DM2 (diabetes mellitus, type 2) ([place])   . HTN (hypertension)   . Kidney congenitally absent, left    small  . Macular degeneration   . Macular degeneration   . Nephrolithiasis    hx  . Renal insufficiency    poss renal vasc dz  . Renal insufficiency   . Rheumatoid arthritis(714.0)   . Rheumatoid arthritis(714.0)   . Umbilical hernia   . Urinary incontinence    Past Surgical History:  Procedure Laterality Date  . 411 echo     mild diastolic dysfunction EF 81%, slt pulm HTN   . cataract signs     Social History   Tobacco Use  . Smoking status: Never Smoker  . Smokeless tobacco: Never Used  Substance Use Topics  . Alcohol use:  No    Alcohol/week: 0.0 oz  . Drug use: No   Family History  Problem Relation Age of Onset  . Heart attack Mother   . Prostate cancer Father    Allergies  Allergen Reactions  . Ace Inhibitors     REACTION: ? reaction  . Atorvastatin     REACTION: ? reaction  . Etodolac     REACTION: swelling and dizziness  . Glipizide     REACTION: low sugar  . Metformin  REACTION: inc cr  . Metoprolol Tartrate     REACTION: ? reaction  . Propoxyphene N-Acetaminophen     REACTION: ? reaction  . Rosuvastatin     REACTION: ? reaction  . Tolterodine Tartrate     REACTION: ? reaction   Current Outpatient Medications on File Prior to Visit  Medication Sig Dispense Refill  . Blood Glucose Monitoring Suppl (CONTOUR BLOOD GLUCOSE SYSTEM) W/DEVICE KIT Use to check blood sugar once daily     . cephALEXin (KEFLEX) 500 MG capsule Take 500 mg by mouth 4 (four) times daily.    Marland Kitchen ezetimibe (ZETIA) 10 MG tablet Take 1 tablet (10 mg total) by mouth daily. 90 tablet 3  . gabapentin (NEURONTIN) 300 MG capsule TAKE 1 CAPSULE TWICE A DAY 180 capsule 3  . glucose blood (BAYER CONTOUR TEST) test strip 1 each by Other route daily. Use as instructed     . losartan-hydrochlorothiazide (HYZAAR) 100-12.5 MG tablet Take 1 tablet by mouth daily. 90 tablet 3  . mupirocin ointment (BACTROBAN) 2 % Apply 1 application topically 2 (two) times daily. To affected area on foot 22 g 3  . silver sulfADIAZINE (SILVADENE) 1 % cream Apply 1 application topically daily. 50 g 0  . tolterodine (DETROL LA) 4 MG 24 hr capsule TAKE 1 CAPSULE DAILY 90 capsule 3   No current facility-administered medications on file prior to visit.     Review of Systems  Constitutional: Positive for fatigue. Negative for activity change, appetite change, fever and unexpected weight change.  HENT: Negative for congestion, ear pain, rhinorrhea, sinus pressure and sore throat.   Eyes: Positive for visual disturbance. Negative for pain and redness.        From macular degeneration  Respiratory: Negative for cough, shortness of breath and wheezing.   Cardiovascular: Negative for chest pain and palpitations.  Gastrointestinal: Negative for abdominal pain, blood in stool, constipation and diarrhea.  Endocrine: Negative for polydipsia and polyuria.  Genitourinary: Negative for dysuria, frequency and urgency.  Musculoskeletal: Positive for arthralgias. Negative for back pain and myalgias.  Skin: Negative for pallor and rash.  Allergic/Immunologic: Negative for environmental allergies.  Neurological: Negative for dizziness, syncope and headaches.  Hematological: Negative for adenopathy. Does not bruise/bleed easily.  Psychiatric/Behavioral: Negative for confusion, decreased concentration and dysphoric mood. The patient is not nervous/anxious.        Objective:   Physical Exam  Constitutional: She appears well-developed and well-nourished. No distress.  obese and well appearing   HENT:  Head: Normocephalic and atraumatic.  Right Ear: External ear normal.  Left Ear: External ear normal.  Nose: Nose normal.  Mouth/Throat: Oropharynx is clear and moist.  Eyes: Conjunctivae and EOM are normal. Pupils are equal, round, and reactive to light. Right eye exhibits no discharge. Left eye exhibits no discharge. No scleral icterus.  Neck: Normal range of motion. Neck supple. No JVD present. Carotid bruit is not present. No thyromegaly present.  Cardiovascular: Normal rate, regular rhythm, normal heart sounds and intact distal pulses. Exam reveals no gallop.  Pulmonary/Chest: Effort normal and breath sounds normal. No respiratory distress. She has no wheezes. She has no rales.  Abdominal: Soft. Bowel sounds are normal. She exhibits no distension and no mass. There is no tenderness.  Genitourinary:  Genitourinary Comments: Declines breast exam or breast cancer screening  Musculoskeletal: She exhibits no edema or tenderness.  Lymphadenopathy:    She has  no cervical adenopathy.  Neurological: She is alert. She has normal reflexes. No cranial  nerve deficit. She exhibits normal muscle tone. Coordination normal.  Foot exam is not done today as pt recently had exam from podiatry and declined needing it  Rev the note -her ulcers are healed/healing and perfusion is good  Skin: Skin is warm and dry. No rash noted. No erythema. No pallor.  Solar lentigines diffusely  Few sks  Psychiatric: She has a normal mood and affect.  Pleasant  Supportive daughter present          Assessment & Plan:   Problem List Items Addressed This Visit      Cardiovascular and Mediastinum   Essential hypertension    bp in fair control at this time  BP Readings from Last 1 Encounters:  06/22/17 130/66   No changes needed Disc lifstyle change with low sodium diet and exercise  Labs ordered Enc wt loss and good fluid intake         Endocrine   Diabetes type 2, controlled ([place])    A1C today  Off all medicine (stopped metformin due to elevated cr) Eating the same  Disc eye care-declines further eye exams  Foot care-recent podiatry visit/doing well  On arb for renal protection      Diabetic nephropathy ([place])    Lab today for a1c and renal panel  No c/o  Is drinking more water for renal protection  On arb      Diabetic neuropathy ([place])    This is unchanged Urged good diet for glucose control  Continues gabapentin which is helpful      RESOLVED: Toe ulcer due to DM ([place])    Per pt much improved with care of podiatry Wearing DM shoes and caring for feet  Continues routine f/u        Musculoskeletal and Integument   Pre-ulcerative calluses    Sees podiatry  Much improved         Genitourinary   Renal insufficiency    With DM nephropathy  DM is better controlled Lab today for renal panel Off metformin  On arb/hctz  (would remove hctz if necessary)  Drinking more water  On arb for renal protection  Continue to follow Avoiding renal  toxic medicines        Other   Anemia    Of chronic dz/ renal insuff Lab today  Has been mild/stable and asymptomatic      Encounter for Medicare annual wellness exam - Primary    Reviewed health habits including diet and exercise and skin cancer prevention Reviewed appropriate screening tests for age  Also reviewed health mt list, fam hx and immunization status , as well as social and family history   See HPI Labs ordered (prev ones rev)  Rev podiatry note Enc wt loss and DM control and exercise as tolerated Declines eye exam and breast/colon screening declines dexa (on vit D , no falls or fx) May be interested in shingrix if available Cognitive function is stable         HYPERCHOLESTEROLEMIA    Disc goals for lipids and reasons to control them Rev labs with pt from last panel and drawn today  Hope for improvement on zetia  Rev low sat fat diet in detail       Macular degeneration (senile) of retina    Pt declines further eye exams or tx at this time       Obesity    Discussed how this problem influences overall health and the risks it imposes  Reviewed plan  for weight loss with lower calorie diet (via better food choices and also portion control or program like weight watchers) and exercise building up to or more than 30 minutes 5 days per week including some aerobic activity   She is unable to exercise much with orthopedic issues / and not very motivated       Routine general medical examination at a health care facility    Reviewed health habits including diet and exercise and skin cancer prevention Reviewed appropriate screening tests for age  Also reviewed health mt list, fam hx and immunization status , as well as social and family history   See HPI Labs ordered (prev ones rev)  Rev podiatry note Enc wt loss and DM control and exercise as tolerated Declines eye exam and breast/colon screening declines dexa (on vit D , no falls or fx) May be interested in  shingrix if available Cognitive function is stable

## 2017-06-23 NOTE — Assessment & Plan Note (Signed)
bp in fair control at this time  BP Readings from Last 1 Encounters:  06/22/17 130/66   No changes needed Disc lifstyle change with low sodium diet and exercise  Labs ordered Enc wt loss and good fluid intake

## 2017-06-23 NOTE — Assessment & Plan Note (Signed)
Reviewed health habits including diet and exercise and skin cancer prevention Reviewed appropriate screening tests for age  Also reviewed health mt list, fam hx and immunization status , as well as social and family history   See HPI Labs ordered (prev ones rev)  Rev podiatry note Enc wt loss and DM control and exercise as tolerated Declines eye exam and breast/colon screening declines dexa (on vit D , no falls or fx) May be interested in shingrix if available Cognitive function is stable

## 2017-06-23 NOTE — Assessment & Plan Note (Signed)
Pt declines further eye exams or tx at this time

## 2017-06-23 NOTE — Assessment & Plan Note (Signed)
Of chronic dz/ renal insuff Lab today  Has been mild/stable and asymptomatic

## 2017-06-23 NOTE — Assessment & Plan Note (Signed)
Sees podiatry  Much improved

## 2017-06-23 NOTE — Assessment & Plan Note (Signed)
Disc goals for lipids and reasons to control them Rev labs with pt from last panel and drawn today  Hope for improvement on zetia  Rev low sat fat diet in detail

## 2017-06-23 NOTE — Assessment & Plan Note (Signed)
With DM nephropathy  DM is better controlled Lab today for renal panel Off metformin  On arb/hctz  (would remove hctz if necessary)  Drinking more water  On arb for renal protection  Continue to follow Avoiding renal toxic medicines

## 2017-06-23 NOTE — Assessment & Plan Note (Signed)
Reviewed health habits including diet and exercise and skin cancer prevention Reviewed appropriate screening tests for age  Also reviewed health mt list, fam hx and immunization status , as well as social and family history   See HPI Labs ordered (prev ones rev)  Rev podiatry note Enc wt loss and DM control and exercise as tolerated Declines eye exam and breast/colon screening declines dexa (on vit D , no falls or fx) May be interested in shingrix if available Cognitive function is stable    

## 2017-06-23 NOTE — Assessment & Plan Note (Signed)
This is unchanged Urged good diet for glucose control  Continues gabapentin which is helpful

## 2017-06-23 NOTE — Assessment & Plan Note (Signed)
Discussed how this problem influences overall health and the risks it imposes  Reviewed plan for weight loss with lower calorie diet (via better food choices and also portion control or program like weight watchers) and exercise building up to or more than 30 minutes 5 days per week including some aerobic activity   She is unable to exercise much with orthopedic issues / and not very motivated

## 2017-06-23 NOTE — Assessment & Plan Note (Signed)
A1C today  Off all medicine (stopped metformin due to elevated cr) Eating the same  Disc eye care-declines further eye exams  Foot care-recent podiatry visit/doing well  On arb for renal protection

## 2017-06-23 NOTE — Assessment & Plan Note (Signed)
Lab today for a1c and renal panel  No c/o  Is drinking more water for renal protection  On arb

## 2017-06-23 NOTE — Assessment & Plan Note (Signed)
Per pt much improved with care of podiatry Wearing DM shoes and caring for feet  Continues routine f/u

## 2017-10-27 ENCOUNTER — Other Ambulatory Visit: Payer: Self-pay | Admitting: Family Medicine

## 2017-10-31 ENCOUNTER — Other Ambulatory Visit: Payer: Self-pay | Admitting: Family Medicine

## 2018-05-12 ENCOUNTER — Ambulatory Visit (INDEPENDENT_AMBULATORY_CARE_PROVIDER_SITE_OTHER): Payer: Medicare Other

## 2018-05-12 DIAGNOSIS — Z23 Encounter for immunization: Secondary | ICD-10-CM | POA: Diagnosis not present

## 2018-05-25 ENCOUNTER — Other Ambulatory Visit: Payer: Self-pay | Admitting: Family Medicine

## 2018-07-24 ENCOUNTER — Other Ambulatory Visit: Payer: Self-pay | Admitting: Family Medicine

## 2018-07-29 ENCOUNTER — Other Ambulatory Visit: Payer: Self-pay | Admitting: Family Medicine

## 2018-09-24 ENCOUNTER — Emergency Department: Payer: Medicare Other

## 2018-09-24 ENCOUNTER — Observation Stay
Admission: EM | Admit: 2018-09-24 | Discharge: 2018-09-29 | Disposition: A | Discharge status: Skilled Nursing Facility | Payer: Medicare Other | Attending: Internal Medicine | Admitting: Internal Medicine

## 2018-09-24 ENCOUNTER — Other Ambulatory Visit: Payer: Self-pay

## 2018-09-24 DIAGNOSIS — I214 Non-ST elevation (NSTEMI) myocardial infarction: Secondary | ICD-10-CM | POA: Diagnosis present

## 2018-09-24 DIAGNOSIS — R2681 Unsteadiness on feet: Secondary | ICD-10-CM | POA: Diagnosis not present

## 2018-09-24 DIAGNOSIS — I129 Hypertensive chronic kidney disease with stage 1 through stage 4 chronic kidney disease, or unspecified chronic kidney disease: Secondary | ICD-10-CM | POA: Diagnosis not present

## 2018-09-24 DIAGNOSIS — R531 Weakness: Secondary | ICD-10-CM

## 2018-09-24 DIAGNOSIS — I10 Essential (primary) hypertension: Secondary | ICD-10-CM | POA: Diagnosis present

## 2018-09-24 DIAGNOSIS — M79605 Pain in left leg: Secondary | ICD-10-CM | POA: Diagnosis not present

## 2018-09-24 DIAGNOSIS — I248 Other forms of acute ischemic heart disease: Principal | ICD-10-CM | POA: Insufficient documentation

## 2018-09-24 DIAGNOSIS — E860 Dehydration: Secondary | ICD-10-CM | POA: Insufficient documentation

## 2018-09-24 DIAGNOSIS — Z8249 Family history of ischemic heart disease and other diseases of the circulatory system: Secondary | ICD-10-CM | POA: Insufficient documentation

## 2018-09-24 DIAGNOSIS — E119 Type 2 diabetes mellitus without complications: Secondary | ICD-10-CM

## 2018-09-24 DIAGNOSIS — M79604 Pain in right leg: Secondary | ICD-10-CM | POA: Insufficient documentation

## 2018-09-24 DIAGNOSIS — M25552 Pain in left hip: Secondary | ICD-10-CM | POA: Insufficient documentation

## 2018-09-24 DIAGNOSIS — Z79899 Other long term (current) drug therapy: Secondary | ICD-10-CM | POA: Diagnosis not present

## 2018-09-24 DIAGNOSIS — F0390 Unspecified dementia without behavioral disturbance: Secondary | ICD-10-CM | POA: Diagnosis not present

## 2018-09-24 DIAGNOSIS — R7989 Other specified abnormal findings of blood chemistry: Secondary | ICD-10-CM | POA: Diagnosis not present

## 2018-09-24 DIAGNOSIS — R778 Other specified abnormalities of plasma proteins: Secondary | ICD-10-CM | POA: Diagnosis present

## 2018-09-24 DIAGNOSIS — H3530 Unspecified macular degeneration: Secondary | ICD-10-CM | POA: Insufficient documentation

## 2018-09-24 DIAGNOSIS — N179 Acute kidney failure, unspecified: Secondary | ICD-10-CM | POA: Diagnosis not present

## 2018-09-24 DIAGNOSIS — M6281 Muscle weakness (generalized): Secondary | ICD-10-CM | POA: Diagnosis not present

## 2018-09-24 DIAGNOSIS — Z888 Allergy status to other drugs, medicaments and biological substances status: Secondary | ICD-10-CM | POA: Insufficient documentation

## 2018-09-24 DIAGNOSIS — E785 Hyperlipidemia, unspecified: Secondary | ICD-10-CM | POA: Diagnosis present

## 2018-09-24 DIAGNOSIS — M069 Rheumatoid arthritis, unspecified: Secondary | ICD-10-CM | POA: Insufficient documentation

## 2018-09-24 DIAGNOSIS — M25559 Pain in unspecified hip: Secondary | ICD-10-CM | POA: Diagnosis present

## 2018-09-24 DIAGNOSIS — N189 Chronic kidney disease, unspecified: Secondary | ICD-10-CM | POA: Insufficient documentation

## 2018-09-24 DIAGNOSIS — E7800 Pure hypercholesterolemia, unspecified: Secondary | ICD-10-CM | POA: Diagnosis not present

## 2018-09-24 DIAGNOSIS — D696 Thrombocytopenia, unspecified: Secondary | ICD-10-CM

## 2018-09-24 DIAGNOSIS — E1122 Type 2 diabetes mellitus with diabetic chronic kidney disease: Secondary | ICD-10-CM | POA: Diagnosis not present

## 2018-09-24 DIAGNOSIS — E1169 Type 2 diabetes mellitus with other specified complication: Secondary | ICD-10-CM | POA: Diagnosis present

## 2018-09-24 LAB — COMPREHENSIVE METABOLIC PANEL
ALK PHOS: 79 U/L (ref 38–126)
ALT: 21 U/L (ref 0–44)
AST: 52 U/L — ABNORMAL HIGH (ref 15–41)
Albumin: 3.7 g/dL (ref 3.5–5.0)
Anion gap: 11 (ref 5–15)
BUN: 52 mg/dL — AB (ref 8–23)
CALCIUM: 9.5 mg/dL (ref 8.9–10.3)
CO2: 16 mmol/L — ABNORMAL LOW (ref 22–32)
CREATININE: 1.62 mg/dL — AB (ref 0.44–1.00)
Chloride: 107 mmol/L (ref 98–111)
GFR calc Af Amer: 33 mL/min — ABNORMAL LOW (ref >60)
GFR calc non Af Amer: 28 mL/min — ABNORMAL LOW (ref >60)
Glucose, Bld: 194 mg/dL — ABNORMAL HIGH (ref 70–99)
Potassium: 4.7 mmol/L (ref 3.5–5.1)
Sodium: 134 mmol/L — ABNORMAL LOW (ref 135–145)
Total Bilirubin: 1.5 mg/dL — ABNORMAL HIGH (ref 0.3–1.2)
Total Protein: 7.5 g/dL (ref 6.5–8.1)

## 2018-09-24 LAB — URINALYSIS, COMPLETE (UACMP) WITH MICROSCOPIC
Bilirubin Urine: NEGATIVE
Glucose, UA: NEGATIVE mg/dL
Ketones, ur: NEGATIVE mg/dL
Leukocytes,Ua: NEGATIVE
Nitrite: NEGATIVE
Protein, ur: NEGATIVE mg/dL
SPECIFIC GRAVITY, URINE: 1.015 (ref 1.005–1.030)
Squamous Epithelial / HPF: NONE SEEN (ref 0–5)
pH: 5 (ref 5.0–8.0)

## 2018-09-24 LAB — CBC WITH DIFFERENTIAL/PLATELET
Abs Immature Granulocytes: 0.02 10*3/uL (ref 0.00–0.07)
Basophils Absolute: 0 10*3/uL (ref 0.0–0.1)
Basophils Relative: 0 %
EOS PCT: 0 %
Eosinophils Absolute: 0 10*3/uL (ref 0.0–0.5)
HCT: 37.4 % (ref 36.0–46.0)
Hemoglobin: 12.7 g/dL (ref 12.0–15.0)
Immature Granulocytes: 0 %
Lymphocytes Relative: 10 %
Lymphs Abs: 0.8 10*3/uL (ref 0.7–4.0)
MCH: 31.7 pg (ref 26.0–34.0)
MCHC: 34 g/dL (ref 30.0–36.0)
MCV: 93.3 fL (ref 80.0–100.0)
Monocytes Absolute: 0.7 10*3/uL (ref 0.1–1.0)
Monocytes Relative: 8 %
Neutro Abs: 6.6 10*3/uL (ref 1.7–7.7)
Neutrophils Relative %: 82 %
PLATELETS: 84 10*3/uL — AB (ref 150–400)
RBC: 4.01 MIL/uL (ref 3.87–5.11)
RDW: 13.1 % (ref 11.5–15.5)
WBC: 8 10*3/uL (ref 4.0–10.5)
nRBC: 0 % (ref 0.0–0.2)

## 2018-09-24 LAB — TROPONIN I: Troponin I: 0.1 ng/mL (ref <0.03)

## 2018-09-24 MED ORDER — SODIUM CHLORIDE 0.9 % IV BOLUS
500.0000 mL | Freq: Once | INTRAVENOUS | Status: AC
  Administered 2018-09-24: 500 mL via INTRAVENOUS

## 2018-09-24 MED ORDER — HEPARIN SODIUM (PORCINE) 5000 UNIT/ML IJ SOLN
4000.0000 [IU] | Freq: Once | INTRAMUSCULAR | Status: AC
  Administered 2018-09-25: 4000 [IU] via INTRAVENOUS

## 2018-09-24 MED ORDER — SODIUM CHLORIDE 0.9 % IV BOLUS
1000.0000 mL | Freq: Once | INTRAVENOUS | Status: AC
  Administered 2018-09-24: 1000 mL via INTRAVENOUS

## 2018-09-24 MED ORDER — HEPARIN (PORCINE) 25000 UT/250ML-% IV SOLN
850.0000 [IU]/h | INTRAVENOUS | Status: DC
  Administered 2018-09-25: 850 [IU]/h via INTRAVENOUS
  Filled 2018-09-24: qty 250

## 2018-09-24 MED ORDER — HEPARIN (PORCINE) 25000 UT/250ML-% IV SOLN: 14.0000 [IU]/kg/h | INTRAVENOUS | Status: DC

## 2018-09-24 NOTE — ED Provider Notes (Signed)
[facility] Emergency Department Provider Note   ____________________________________________   First MD Initiated Contact with Patient 09/24/18 2342     (approximate)  I have reviewed the triage vital signs and the nursing notes.   HISTORY  Chief Complaint Weakness and Diarrhea    HPI Kristina Bennett is a 85 y.o. female who presents to the ED from home with a chief complaint of generalized weakness. Daughter states patient has been unable to get up for most of the day. Patient's son has been sick with cold-like symptoms. Patient began to cough today. Also having some diarrhea. Denies fever, chills, chest pain, shortness of breath, abdominal pain, nausea, vomiting or dysuria. Denies recent trauma or travel.       Past Medical History:  Diagnosis Date  . Anemia   . Anemia   . Diabetes mellitus   . DM2 (diabetes mellitus, type 2) ([place])   . HTN (hypertension)   . Kidney congenitally absent, left    small  . Macular degeneration   . Macular degeneration   . Nephrolithiasis    hx  . Renal insufficiency    poss renal vasc dz  . Renal insufficiency   . Rheumatoid arthritis(714.0)   . Rheumatoid arthritis(714.0)   . Umbilical hernia   . Urinary incontinence     Patient Active Problem List   Diagnosis Date Noted  . Acute on chronic kidney failure ([place]) 09/25/2018  . Elevated troponin 09/25/2018  . Obesity 11/05/2015  . Routine general medical examination at a health care facility 05/07/2015  . Encounter for Medicare annual wellness exam 05/01/2014  . Pre-ulcerative calluses 11/01/2013  . Pedal edema 11/14/2009  . Renal insufficiency 09/24/2009  . VARICOSE VEINS, LOWER EXTREMITIES 12/05/2008  . Anemia 07/05/2007  . Diabetes type 2, controlled ([place]) 04/08/2007  . Diabetic nephropathy ([place]) 04/08/2007  . Diabetic neuropathy ([place]) 04/08/2007  . HYPERCHOLESTEROLEMIA 04/08/2007  . Macular degeneration (senile) of retina 04/08/2007  . Essential  hypertension 04/08/2007  . URINARY INCONTINENCE 04/08/2007  . NEPHROLITHIASIS, HX OF 04/08/2007    Past Surgical History:  Procedure Laterality Date  . 411 echo     mild diastolic dysfunction EF 19%, slt pulm HTN   . cataract signs      Prior to Admission medications   Medication Sig Start Date End Date Taking? Authorizing Provider  Cholecalciferol (VITAMIN D3) 50 MCG (2000 UT) capsule Take 2,000 Units by mouth daily.   Yes [provider]  ezetimibe (ZETIA) 10 MG tablet TAKE 1 TABLET DAILY Patient taking differently: Take 10 mg by mouth at bedtime.  07/25/18  Yes Tower, Wynelle Fanny, MD  losartan-hydrochlorothiazide (HYZAAR) 100-12.5 MG tablet TAKE 1 TABLET BY MOUTH DAILY 05/25/18  Yes Tower, Marne A, MD  tolterodine (DETROL LA) 4 MG 24 hr capsule TAKE 1 CAPSULE DAILY Patient taking differently: Take 4 mg by mouth daily.  07/29/18  Yes Tower, Wynelle Fanny, MD  Blood Glucose Monitoring Suppl (CONTOUR BLOOD GLUCOSE SYSTEM) W/DEVICE KIT Use to check blood sugar once daily     [provider]  gabapentin (NEURONTIN) 300 MG capsule TAKE 1 CAPSULE TWICE A DAY Patient not taking: Reported on 09/25/2018 11/12/16   Tower, Roque Lias A, MD  glucose blood (BAYER CONTOUR TEST) test strip 1 each by Other route daily. Use as instructed     [provider]  mupirocin ointment (BACTROBAN) 2 % Apply 1 application topically 2 (two) times daily. To affected area on foot Patient not taking: Reported on 09/25/2018  05/01/14   Tower, Wynelle Fanny, MD  silver sulfADIAZINE (SILVADENE) 1 % cream Apply 1 application topically daily. Patient not taking: Reported on 09/25/2018 03/28/15   Trula Slade, DPM    Allergies Etodolac; Ace inhibitors; Atorvastatin; Glimepiride; Glipizide; Metformin; Metoprolol tartrate; Propoxyphene n-acetaminophen; and Rosuvastatin  Family History  Problem Relation Age of Onset  . Heart attack Mother   . Prostate cancer Father     Social History Social History   Tobacco Use    . Smoking status: Never Smoker  . Smokeless tobacco: Never Used  Substance Use Topics  . Alcohol use: No    Alcohol/week: 0.0 standard drinks  . Drug use: No    Review of Systems  Constitutional: Positive for generalized weakness. No fever/chills Eyes: No visual changes. ENT: No sore throat. Cardiovascular: Denies chest pain. Respiratory: Positive for dry cough. Denies shortness of breath. Gastrointestinal: No abdominal pain.  No nausea, no vomiting.  Positive for diarrhea.  No constipation. Genitourinary: Negative for dysuria. Musculoskeletal: Negative for back pain. Skin: Negative for rash. Neurological: Negative for headaches, focal weakness or numbness.   ____________________________________________   PHYSICAL EXAM:  VITAL SIGNS: ED Triage Vitals  Enc Vitals Group     BP 09/24/18 2117 (!) 150/71     Pulse Rate 09/24/18 2117 100     Resp 09/24/18 2119 (!) 21     Temp 09/24/18 2119 97.8 F (36.6 C)     Temp Source 09/24/18 2119 Oral     SpO2 09/24/18 2117 97 %     Weight 09/24/18 2121 184 lb (83.5 kg)     Height 09/24/18 2121 5' 2"  (1.575 m)     Head Circumference --      Peak Flow --      Pain Score 09/24/18 2121 0     Pain Loc --      Pain Edu? --      Excl. in [place]? --     Constitutional: Alert and oriented. Well appearing and in mild acute distress. Eyes: Conjunctivae are normal. PERRL. EOMI. Head: Atraumatic. Nose: No congestion/rhinnorhea. Mouth/Throat: Mucous membranes are moist.  Oropharynx non-erythematous. Neck: No stridor.   Cardiovascular: Normal rate, regular rhythm. Grossly normal heart sounds.  Good peripheral circulation. Respiratory: Normal respiratory effort.  No retractions. Lungs CTAB. Gastrointestinal: Soft and nontender.  Reducible umbilical hernia.  No distention. No abdominal bruits. No CVA tenderness. Musculoskeletal: No lower extremity tenderness nor edema.  No joint effusions. Neurologic:  Normal speech and language. No gross focal  neurologic deficits are appreciated.  Skin:  Skin is warm, dry and intact. No rash noted.  No petechiae. Psychiatric: Mood and affect are normal. Speech and behavior are normal.  ____________________________________________   LABS (all labs ordered are listed, but only abnormal results are displayed)  Labs Reviewed  COMPREHENSIVE METABOLIC PANEL - Abnormal; Notable for the following components:      Result Value   Sodium 134 (*)    CO2 16 (*)    Glucose, Bld 194 (*)    BUN 52 (*)    Creatinine, Ser 1.62 (*)    AST 52 (*)    Total Bilirubin 1.5 (*)    GFR calc non Af Amer 28 (*)    GFR calc Af Amer 33 (*)    All other components within normal limits  CBC WITH DIFFERENTIAL/PLATELET - Abnormal; Notable for the following components:   Platelets 84 (*)    All other components within normal limits  URINALYSIS, COMPLETE (UACMP) WITH  MICROSCOPIC - Abnormal; Notable for the following components:   Color, Urine YELLOW (*)    APPearance HAZY (*)    Hgb urine dipstick MODERATE (*)    Bacteria, UA RARE (*)    All other components within normal limits  TROPONIN I - Abnormal; Notable for the following components:   Troponin I 0.10 (*)    All other components within normal limits  CBC - Abnormal; Notable for the following components:   RBC 3.39 (*)    Hemoglobin 10.7 (*)    HCT 31.2 (*)    Platelets 102 (*)    All other components within normal limits  TROPONIN I - Abnormal; Notable for the following components:   Troponin I 0.12 (*)    All other components within normal limits  BASIC METABOLIC PANEL - Abnormal; Notable for the following components:   CO2 18 (*)    Glucose, Bld 126 (*)    BUN 50 (*)    Creatinine, Ser 1.40 (*)    Calcium 8.8 (*)    GFR calc non Af Amer 34 (*)    GFR calc Af Amer 39 (*)    All other components within normal limits  GLUCOSE, CAPILLARY - Abnormal; Notable for the following components:   Glucose-Capillary 120 (*)    All other components within  normal limits  URINE CULTURE  INFLUENZA PANEL BY PCR (TYPE A & B)  HEPARIN LEVEL (UNFRACTIONATED)  TROPONIN I  TROPONIN I   ____________________________________________  EKG  ED ECG REPORT I, Shonnie Poudrier J, the attending physician, personally viewed and interpreted this ECG.   Date: 09/24/2018  EKG Time: 2121  Rate: 98  Rhythm: normal EKG, normal sinus rhythm  Axis: Normal  Intervals:nonspecific intraventricular conduction delay  ST&T Change: Nonspecific  ____________________________________________  RADIOLOGY  ED MD interpretation: No acute cardiopulmonary process  Official radiology report(s): Dg Chest Port 1 View  Result Date: 09/25/2018 CLINICAL DATA:  Chest pain. Weakness. EXAM: PORTABLE CHEST 1 VIEW COMPARISON:  None. FINDINGS: Mild cardiomegaly. Tortuous atherosclerotic thoracic aorta. No focal airspace disease, pulmonary edema, large pleural effusion or pneumothorax. Scoliotic curvature of the spine. IMPRESSION: Mild cardiomegaly. No congestive failure or acute pulmonary process. Aortic Atherosclerosis (ICD10-I70.0). Electronically Signed   By: Keith Rake M.D.   On: 09/25/2018 00:24    ____________________________________________   PROCEDURES  Procedure(s) performed (including Critical Care):  Procedures  CRITICAL CARE Performed by: Paulette Blanch   Total critical care time: 45 minutes  Critical care time was exclusive of separately billable procedures and treating other patients.  Critical care was necessary to treat or prevent imminent or life-threatening deterioration.  Critical care was time spent personally by me on the following activities: development of treatment plan with patient and/or surrogate as well as nursing, discussions with consultants, evaluation of patient's response to treatment, examination of patient, obtaining history from patient or surrogate, ordering and performing treatments and interventions, ordering and review of laboratory  studies, ordering and review of radiographic studies, pulse oximetry and re-evaluation of patient's condition. ____________________________________________   INITIAL IMPRESSION / ASSESSMENT AND PLAN / ED COURSE  As part of my medical decision making, I reviewed the following data within the [place] History obtained from family, Nursing notes reviewed and incorporated, Labs reviewed, EKG interpreted, Old chart reviewed, Radiograph reviewed, Discussed with admitting physician and Notes from prior ED visits     85 year old female who presents for generalized weakness, dry cough and diarrhea.  Differential diagnosis includes but is not limited  to ACS, infectious, metabolic etiologies, etc.  Laboratory results notable for thrombocytopenia, acute kidney injury indicating dehydration and elevated troponin.  Will start heparin bolus and drip.  Obtain chest x-ray.  Will discuss with hospitalist Dr. Jannifer Franklin evaluate patient in the emergency department for admission.      ____________________________________________   FINAL CLINICAL IMPRESSION(S) / ED DIAGNOSES  Final diagnoses:  Dehydration  Generalized weakness  Non-STEMI (non-ST elevated myocardial infarction) ([place])  Thrombocytopenia [facility])     ED Discharge Orders    None       Note:  This document was prepared using Dragon voice recognition software and may include unintentional dictation errors.   Paulette Blanch, MD 09/25/18 [phone]

## 2018-09-24 NOTE — ED Notes (Signed)
Pt given pillow, repositioned in bed.

## 2018-09-24 NOTE — ED Notes (Signed)
Critical troponin result was verbally given to Dolores Frame MD by this RN.

## 2018-09-24 NOTE — ED Notes (Signed)
Grn, Blue, Lav tubes sent to lab.

## 2018-09-24 NOTE — Progress Notes (Signed)
ANTICOAGULATION CONSULT NOTE - Initial Consult  Pharmacy Consult for Heparin Drip  Indication: chest pain/ACS  Allergies  Allergen Reactions  . Ace Inhibitors     REACTION: ? reaction  . Atorvastatin     REACTION: ? reaction  . Etodolac     REACTION: swelling and dizziness  . Glipizide     REACTION: low sugar  . Metformin     REACTION: inc cr  . Metoprolol Tartrate     REACTION: ? reaction  . Propoxyphene N-Acetaminophen     REACTION: ? reaction  . Rosuvastatin     REACTION: ? reaction  . Tolterodine Tartrate     REACTION: ? reaction    Patient Measurements: Height: 5\' 2"  (157.5 cm) Weight: 184 lb (83.5 kg) IBW/kg (Calculated) : 50.1 Heparin Dosing Weight: 68.9 kg   Vital Signs: Temp: 97.8 F (36.6 C) (03/07 2119) Temp Source: Oral (03/07 2119) BP: 127/57 (03/07 2245) Pulse Rate: 81 (03/07 2245)  Labs: Recent Labs    09/24/18 2140  HGB 12.7  HCT 37.4  PLT 84*  CREATININE 1.62*  TROPONINI 0.10*    Estimated Creatinine Clearance: 25 mL/min (A) (by C-G formula based on SCr of 1.62 mg/dL (H)).   Medical History: Past Medical History:  Diagnosis Date  . Anemia   . Anemia   . Diabetes mellitus   . DM2 (diabetes mellitus, type 2) ([facility])   . HTN (hypertension)   . Kidney congenitally absent, left    small  . Macular degeneration   . Macular degeneration   . Nephrolithiasis    hx  . Renal insufficiency    poss renal vasc dz  . Renal insufficiency   . Rheumatoid arthritis(714.0)   . Rheumatoid arthritis(714.0)   . Umbilical hernia   . Urinary incontinence    Assessment: Patient is a 85yo female with elevated troponin level. Pharmacy consulted for Heparin dosing. Reviewed home medications, no anticoagulants listed prior to admission.  Goal of Therapy:  Heparin level 0.3-0.7 units/ml Monitor platelets by anticoagulation protocol: Yes   Plan:  Give 4000 units bolus x 1 Start heparin infusion at 850 units/hr Check anti-Xa level in 8 hours and  daily while on heparin Continue to monitor H&H and platelets  Clovia Cuff, PharmD, BCPS 09/25/2018 12:01 AM

## 2018-09-24 NOTE — ED Notes (Signed)
EKG completed

## 2018-09-24 NOTE — ED Notes (Signed)
Pt's family at bedside. States pt is not officially dx with dementia but has had slow changes in mental status (short term memory) over time. Will be seen by PCP about this per family this week.

## 2018-09-24 NOTE — ED Triage Notes (Signed)
Pt in via EMS from home (lives with son) d/t weakness and diarrhea. Having trouble walking at home today; usually uses walker. Hist HTN, Abd hernia, dementia. NKA. 170/92; 98HR, 97% RA, 98.2 oral per EMS.

## 2018-09-24 NOTE — ED Notes (Signed)
Pt calm and cooperative.

## 2018-09-25 ENCOUNTER — Observation Stay (HOSPITAL_BASED_OUTPATIENT_CLINIC_OR_DEPARTMENT_OTHER)
Admit: 2018-09-25 | Discharge: 2018-09-25 | Disposition: A | Discharge status: Home / Self Care | Payer: Medicare Other | Attending: Internal Medicine | Admitting: Internal Medicine

## 2018-09-25 ENCOUNTER — Observation Stay: Payer: Medicare Other

## 2018-09-25 DIAGNOSIS — R778 Other specified abnormalities of plasma proteins: Secondary | ICD-10-CM | POA: Diagnosis present

## 2018-09-25 DIAGNOSIS — N179 Acute kidney failure, unspecified: Secondary | ICD-10-CM | POA: Diagnosis present

## 2018-09-25 DIAGNOSIS — N189 Chronic kidney disease, unspecified: Secondary | ICD-10-CM | POA: Diagnosis present

## 2018-09-25 DIAGNOSIS — I340 Nonrheumatic mitral (valve) insufficiency: Secondary | ICD-10-CM | POA: Diagnosis not present

## 2018-09-25 DIAGNOSIS — R7989 Other specified abnormal findings of blood chemistry: Secondary | ICD-10-CM

## 2018-09-25 LAB — CBC
HCT: 31.2 % — ABNORMAL LOW (ref 36.0–46.0)
Hemoglobin: 10.7 g/dL — ABNORMAL LOW (ref 12.0–15.0)
MCH: 31.6 pg (ref 26.0–34.0)
MCHC: 34.3 g/dL (ref 30.0–36.0)
MCV: 92 fL (ref 80.0–100.0)
Platelets: 102 10*3/uL — ABNORMAL LOW (ref 150–400)
RBC: 3.39 MIL/uL — ABNORMAL LOW (ref 3.87–5.11)
RDW: 13.2 % (ref 11.5–15.5)
WBC: 7.3 10*3/uL (ref 4.0–10.5)
nRBC: 0 % (ref 0.0–0.2)

## 2018-09-25 LAB — BASIC METABOLIC PANEL
Anion gap: 8 (ref 5–15)
BUN: 50 mg/dL — ABNORMAL HIGH (ref 8–23)
CO2: 18 mmol/L — AB (ref 22–32)
Calcium: 8.8 mg/dL — ABNORMAL LOW (ref 8.9–10.3)
Chloride: 110 mmol/L (ref 98–111)
Creatinine, Ser: 1.4 mg/dL — ABNORMAL HIGH (ref 0.44–1.00)
GFR calc Af Amer: 39 mL/min — ABNORMAL LOW (ref >60)
GFR calc non Af Amer: 34 mL/min — ABNORMAL LOW (ref >60)
Glucose, Bld: 126 mg/dL — ABNORMAL HIGH (ref 70–99)
POTASSIUM: 3.5 mmol/L (ref 3.5–5.1)
Sodium: 136 mmol/L (ref 135–145)

## 2018-09-25 LAB — HEPARIN LEVEL (UNFRACTIONATED): Heparin Unfractionated: 0.53 IU/mL (ref 0.30–0.70)

## 2018-09-25 LAB — INFLUENZA PANEL BY PCR (TYPE A & B)
Influenza A By PCR: NEGATIVE
Influenza B By PCR: NEGATIVE

## 2018-09-25 LAB — GLUCOSE, CAPILLARY
GLUCOSE-CAPILLARY: 118 mg/dL — AB (ref 70–99)
Glucose-Capillary: 120 mg/dL — ABNORMAL HIGH (ref 70–99)
Glucose-Capillary: 92 mg/dL (ref 70–99)
Glucose-Capillary: 86 mg/dL (ref 70–99)
Glucose-Capillary: 121 mg/dL — ABNORMAL HIGH (ref 70–99)

## 2018-09-25 LAB — ECHOCARDIOGRAM COMPLETE
Height: 62 in
Weight: 2673.74 oz

## 2018-09-25 LAB — TROPONIN I
Troponin I: 0.12 ng/mL (ref <0.03)
Troponin I: 0.11 ng/mL (ref <0.03)

## 2018-09-25 MED ORDER — INSULIN ASPART 100 UNIT/ML ~~LOC~~ SOLN: 0.0000 [IU] | Freq: Every day | SUBCUTANEOUS | Status: DC

## 2018-09-25 MED ORDER — ONDANSETRON HCL 4 MG PO TABS: 4.0000 mg | ORAL_TABLET | Freq: Four times a day (QID) | ORAL | Status: DC | PRN

## 2018-09-25 MED ORDER — ACETAMINOPHEN 650 MG RE SUPP: 650.0000 mg | Freq: Four times a day (QID) | RECTAL | Status: DC | PRN

## 2018-09-25 MED ORDER — ACETAMINOPHEN 325 MG PO TABS: 650.0000 mg | ORAL_TABLET | Freq: Four times a day (QID) | ORAL | Status: DC | PRN

## 2018-09-25 MED ORDER — INSULIN ASPART 100 UNIT/ML ~~LOC~~ SOLN
0.0000 [IU] | Freq: Three times a day (TID) | SUBCUTANEOUS | Status: DC
  Administered 2018-09-26 – 2018-09-28 (×2): 1 [IU] via SUBCUTANEOUS
  Filled 2018-09-25 (×4): qty 1

## 2018-09-25 MED ORDER — ONDANSETRON HCL 4 MG/2ML IJ SOLN: 4.0000 mg | Freq: Four times a day (QID) | INTRAMUSCULAR | Status: DC | PRN

## 2018-09-25 MED ORDER — HEPARIN SODIUM (PORCINE) 5000 UNIT/ML IJ SOLN
5000.0000 [IU] | Freq: Three times a day (TID) | INTRAMUSCULAR | Status: DC
  Administered 2018-09-25 – 2018-09-29 (×6): 5000 [IU] via SUBCUTANEOUS
  Filled 2018-09-25 (×7): qty 1

## 2018-09-25 NOTE — Progress Notes (Signed)
ANTICOAGULATION CONSULT NOTE   Pharmacy Consult for Heparin Drip  Indication: chest pain/ACS  Allergies  Allergen Reactions  . Etodolac Shortness Of Breath and Other (See Comments)    REACTION: dizziness  . Ace Inhibitors Other (See Comments)    REACTION: unknown  . Atorvastatin Other (See Comments)    Reaction: muscle aches   . Glimepiride Other (See Comments)    Reaction: hypoglycemia  . Glipizide Other (See Comments)    REACTION: low sugar  . Metformin Other (See Comments)    REACTION: increased creatinine  . Metoprolol Tartrate Other (See Comments)    Reaction: unknown  . Propoxyphene N-Acetaminophen Other (See Comments)    Reaction: unknown  . Rosuvastatin Other (See Comments)    Reaction: muscle aches     Patient Measurements: Height: 5\' 2"  (157.5 cm) Weight: 167 lb 1.7 oz (75.8 kg) IBW/kg (Calculated) : 50.1 Heparin Dosing Weight: 68.9 kg   Vital Signs: Temp: 98 F (36.7 C) (03/08 0728) Temp Source: Oral (03/08 0728) BP: 126/57 (03/08 0728) Pulse Rate: 80 (03/08 0728)  Labs: Recent Labs    09/24/18 2140 09/25/18 0415 09/25/18 0835  HGB 12.7 10.7*  --   HCT 37.4 31.2*  --   PLT 84* 102*  --   HEPARINUNFRC  --   --  0.53  CREATININE 1.62* 1.40*  --   TROPONINI 0.10* 0.12* 0.11*    Estimated Creatinine Clearance: 27.5 mL/min (A) (by C-G formula based on SCr of 1.4 mg/dL (H)).   Medical History: Past Medical History:  Diagnosis Date  . Anemia   . Anemia   . Diabetes mellitus   . DM2 (diabetes mellitus, type 2) ([facility])   . HTN (hypertension)   . Kidney congenitally absent, left    small  . Macular degeneration   . Macular degeneration   . Nephrolithiasis    hx  . Renal insufficiency    poss renal vasc dz  . Renal insufficiency   . Rheumatoid arthritis(714.0)   . Rheumatoid arthritis(714.0)   . Umbilical hernia   . Urinary incontinence    Assessment: Patient is a 85yo female with elevated troponin level. Pharmacy consulted for Heparin  dosing. Reviewed home medications, no anticoagulants listed prior to admission.  Goal of Therapy:  Heparin level 0.3-0.7 units/ml Monitor platelets by anticoagulation protocol: Yes   Plan:  Give 4000 units bolus x 1 Start heparin infusion at 850 units/hr Check anti-Xa level in 8 hours and daily while on heparin Continue to monitor H&H and platelets    3/8  08:35 HL = 0.53.  Recheck tonight at 16:30.  Stormy Card, [facility] 09/25/2018 10:07 AM

## 2018-09-25 NOTE — Consult Note (Signed)
Cardiology Consultation Note    Patient ID: Kristina Bennett, MRN: [id number], DOB/AGE: 1931-03-13 85 y.o. Admit date: 09/24/2018   Date of Consult: 09/25/2018 Primary Physician: Tower, Wynelle Fanny, MD Primary Cardiologist:   Chief Complaint: diarrhea/weakness Reason for Consultation: abnormal troponin Requesting MD: Dr. Stark Jock  HPI: Kristina Bennett is a 85 y.o. female with history of diabetes, chronic kidney disease, murmur admitted with several day history of anorexia, diarrhea and proximal muscle weakness.  She has a history of mild memory disorder however denied any complaints of chest pain to myself, the ER physicians or family members.  In the emergency room she was noted to have acute on chronic renal insufficiency with a serum creatinine of 1.62.  Troponin drawn as part of protocol showed mildly elevated level of 0.10-0.12. EKG showed normal sinus rhythm with nonspecific st t wave changes. Echo pending.   Past Medical History:  Diagnosis Date  . Anemia   . Anemia   . Diabetes mellitus   . DM2 (diabetes mellitus, type 2) ([place])   . HTN (hypertension)   . Kidney congenitally absent, left    small  . Macular degeneration   . Macular degeneration   . Nephrolithiasis    hx  . Renal insufficiency    poss renal vasc dz  . Renal insufficiency   . Rheumatoid arthritis(714.0)   . Rheumatoid arthritis(714.0)   . Umbilical hernia   . Urinary incontinence       Surgical History:  Past Surgical History:  Procedure Laterality Date  . 411 echo     mild diastolic dysfunction EF 87%, slt pulm HTN   . cataract signs       Home Meds: Prior to Admission medications   Medication Sig Start Date End Date Taking? Authorizing Provider  Cholecalciferol (VITAMIN D3) 50 MCG (2000 UT) capsule Take 2,000 Units by mouth daily.   Yes [provider]  ezetimibe (ZETIA) 10 MG tablet TAKE 1 TABLET DAILY Patient taking differently: Take 10 mg by mouth at bedtime.  07/25/18  Yes Tower, Wynelle Fanny, MD   losartan-hydrochlorothiazide (HYZAAR) 100-12.5 MG tablet TAKE 1 TABLET BY MOUTH DAILY 05/25/18  Yes Tower, Marne A, MD  tolterodine (DETROL LA) 4 MG 24 hr capsule TAKE 1 CAPSULE DAILY Patient taking differently: Take 4 mg by mouth daily.  07/29/18  Yes Tower, Wynelle Fanny, MD  Blood Glucose Monitoring Suppl (CONTOUR BLOOD GLUCOSE SYSTEM) W/DEVICE KIT Use to check blood sugar once daily     [provider]  gabapentin (NEURONTIN) 300 MG capsule TAKE 1 CAPSULE TWICE A DAY Patient not taking: Reported on 09/25/2018 11/12/16   Tower, Roque Lias A, MD  glucose blood (BAYER CONTOUR TEST) test strip 1 each by Other route daily. Use as instructed     [provider]  mupirocin ointment (BACTROBAN) 2 % Apply 1 application topically 2 (two) times daily. To affected area on foot Patient not taking: Reported on 09/25/2018 05/01/14   Tower, Wynelle Fanny, MD  silver sulfADIAZINE (SILVADENE) 1 % cream Apply 1 application topically daily. Patient not taking: Reported on 09/25/2018 03/28/15   Trula Slade, DPM    Inpatient Medications:  . insulin aspart  0-5 Units Subcutaneous QHS  . insulin aspart  0-9 Units Subcutaneous TID WC     Allergies:  Allergies  Allergen Reactions  . Etodolac Shortness Of Breath and Other (See Comments)    REACTION: dizziness  . Ace Inhibitors Other (See Comments)    REACTION: unknown  .  Atorvastatin Other (See Comments)    Reaction: muscle aches   . Glimepiride Other (See Comments)    Reaction: hypoglycemia  . Glipizide Other (See Comments)    REACTION: low sugar  . Metformin Other (See Comments)    REACTION: increased creatinine  . Metoprolol Tartrate Other (See Comments)    Reaction: unknown  . Propoxyphene N-Acetaminophen Other (See Comments)    Reaction: unknown  . Rosuvastatin Other (See Comments)    Reaction: muscle aches     Social History   Socioeconomic History  . Marital status: Widowed    Spouse name: Not on file  . Number of children: Not on file   . Years of education: Not on file  . Highest education level: Not on file  Occupational History  . Not on file  Social Needs  . Financial resource strain: Not on file  . Food insecurity:    Worry: Not on file    Inability: Not on file  . Transportation needs:    Medical: Not on file    Non-medical: Not on file  Tobacco Use  . Smoking status: Never Smoker  . Smokeless tobacco: Never Used  Substance and Sexual Activity  . Alcohol use: No    Alcohol/week: 0.0 standard drinks  . Drug use: No  . Sexual activity: Never  Lifestyle  . Physical activity:    Days per week: Not on file    Minutes per session: Not on file  . Stress: Not on file  Relationships  . Social connections:    Talks on phone: Not on file    Gets together: Not on file    Attends religious service: Not on file    Active member of club or organization: Not on file    Attends meetings of clubs or organizations: Not on file    Relationship status: Not on file  . Intimate partner violence:    Fear of current or ex partner: Not on file    Emotionally abused: Not on file    Physically abused: Not on file    Forced sexual activity: Not on file  Other Topics Concern  . Not on file  Social History Narrative   Cares for husband at home who is also diabetic.      Family History  Problem Relation Age of Onset  . Heart attack Mother   . Prostate cancer Father      Review of Systems: A 12-system review of systems was performed and is negative except as noted in the HPI.  Labs: Recent Labs    09/24/18 2140 09/25/18 0415 09/25/18 0835  TROPONINI 0.10* 0.12* 0.11*   Lab Results  Component Value Date   WBC 7.3 09/25/2018   HGB 10.7 (L) 09/25/2018   HCT 31.2 (L) 09/25/2018   MCV 92.0 09/25/2018   PLT 102 (L) 09/25/2018    Recent Labs  Lab 09/24/18 2140 09/25/18 0415  NA 134* 136  K 4.7 3.5  CL 107 110  CO2 16* 18*  BUN 52* 50*  CREATININE 1.62* 1.40*  CALCIUM 9.5 8.8*  PROT 7.5  --   BILITOT  1.5*  --   ALKPHOS 79  --   ALT 21  --   AST 52*  --   GLUCOSE 194* 126*   Lab Results  Component Value Date   CHOL 195 06/22/2017   HDL 52.60 06/22/2017   LDLCALC 125 (H) 06/22/2017   TRIG 87.0 06/22/2017   No results found for: DDIMER  Radiology/Studies:  Dg Chest Port 1 View  Result Date: 09/25/2018 CLINICAL DATA:  Chest pain. Weakness. EXAM: PORTABLE CHEST 1 VIEW COMPARISON:  None. FINDINGS: Mild cardiomegaly. Tortuous atherosclerotic thoracic aorta. No focal airspace disease, pulmonary edema, large pleural effusion or pneumothorax. Scoliotic curvature of the spine. IMPRESSION: Mild cardiomegaly. No congestive failure or acute pulmonary process. Aortic Atherosclerosis (ICD10-I70.0). Electronically Signed   By: Keith Rake M.D.   On: 09/25/2018 00:24    Wt Readings from Last 3 Encounters:  09/25/18 75.8 kg  06/22/17 78.2 kg  03/10/17 79.6 kg    EKG: nsr with no ischemia.  Physical Exam:  Blood pressure (!) 126/57, pulse 80, temperature 98 F (36.7 C), temperature source Oral, resp. rate 18, height 5' 2"  (1.575 m), weight 75.8 kg, SpO2 99 %. Body mass index is 30.56 kg/m. General: Well developed, well nourished, in no acute distress. Head: Normocephalic, atraumatic, sclera non-icteric, no xanthomas, nares are without discharge.  Neck: Negative for carotid bruits. JVD not elevated. Lungs: Clear bilaterally to auscultation without wheezes, rales, or rhonchi. Breathing is unlabored. Heart: RRR with S1 S2. No murmurs, rubs, or gallops appreciated. Abdomen: Soft, non-tender, non-distended with normoactive bowel sounds. No hepatomegaly. No rebound/guarding. No obvious abdominal masses. Msk:  Strength and tone appear normal for age. Extremities: No clubbing or cyanosis. No edema.  Distal pedal pulses are 2+ and equal bilaterally. Neuro: Alert and oriented X 3. No facial asymmetry. No focal deficit. Moves all extremities spontaneously. Psych:  Responds to questions  appropriately with a normal affect.     Assessment and Plan   85 y.o. female with history of diabetes, chronic kidney disease, murmur admitted with several day history of anorexia, diarrhea and proximal muscle weakness.  She has a history of mild memory disorder however denied any complaints of chest pain to myself, the ER physicians or family members.  In the emergency room she was noted to have acute on chronic renal insufficiency with a serum creatinine of 1.62.  Troponin drawn as part of protocol showed mildly elevated level of 0.10-0.12. EKG showed normal sinus rhythm with nonspecific st t wave changes.  She is doing well at present.  Denies any chest pain.  Elevated troponin appears to be demand ischemia.  Does not appear to be acute coronary syndrome given the presenting scenario, electrocardiographic tracing and flat troponins.  This was likely exacerbated by the acute on chronic renal insufficiency.  Not a candidate for cardiac cath at present.  Will discontinue heparin and continue with her current regimen.  Further recommendations based on echo report and course.  Signed, Teodoro Spray MD 09/25/2018, 10:42 AM Pager: [phone]

## 2018-09-25 NOTE — Progress Notes (Signed)
*  PRELIMINARY RESULTS* Echocardiogram 2D Echocardiogram has been performed.  Kristina Bennett Kristina Bennett 09/25/2018, 9:49 AM

## 2018-09-25 NOTE — ED Notes (Signed)
ED TO INPATIENT HANDOFF REPORT  ED Nurse Name and Phone #: Lurena JoinerRebecca [phone]  S Name/Age/Gender Kristina Bennett 85 y.o. female Room/Bed: ED17A/ED17A  Code Status   Code Status: Not on file  Home/SNF/Other Home Patient oriented to: self, situation Is this baseline? Yes   Triage Complete: Triage complete  Chief Complaint weakness  Triage Note Pt in via EMS from home (lives with son) d/t weakness and diarrhea. Having trouble walking at home today; usually uses walker. Hist HTN, Abd hernia, dementia. NKA. 170/92; 98HR, 97% RA, 98.2 oral per EMS.    Allergies Allergies  Allergen Reactions  . Etodolac Shortness Of Breath and Other (See Comments)    REACTION: dizziness  . Ace Inhibitors Other (See Comments)    REACTION: unknown  . Atorvastatin Other (See Comments)    Reaction: muscle aches   . Glimepiride Other (See Comments)    Reaction: hypoglycemia  . Glipizide Other (See Comments)    REACTION: low sugar  . Metformin Other (See Comments)    REACTION: increased creatinine  . Metoprolol Tartrate Other (See Comments)    Reaction: unknown  . Propoxyphene N-Acetaminophen Other (See Comments)    Reaction: unknown  . Rosuvastatin Other (See Comments)    Reaction: muscle aches     Level of Care/Admitting Diagnosis ED Disposition    ED Disposition Condition Comment   Admit  [facility]ospital [facility] [[id number]]  Level of Care: Telemetry [5]  Diagnosis: Elevated troponin [321909]  Admitting Physician: Oralia ManisWILLIS, DAVID [[id number]]  Attending Physician: Oralia ManisWILLIS, DAVID [[id number]]  Bed request comments: 2a  PT Class (Do Not Modify): Observation [104]  PT Acc Code (Do Not Modify): Observation [10022]       B Medical/Surgery History Past Medical History:  Diagnosis Date  . Anemia   . Anemia   . Diabetes mellitus   . DM2 (diabetes mellitus, type 2) ([facility])   . HTN (hypertension)   . Kidney congenitally absent, left    small  . Macular degeneration   .  Macular degeneration   . Nephrolithiasis    hx  . Renal insufficiency    poss renal vasc dz  . Renal insufficiency   . Rheumatoid arthritis(714.0)   . Rheumatoid arthritis(714.0)   . Umbilical hernia   . Urinary incontinence    Past Surgical History:  Procedure Laterality Date  . 411 echo     mild diastolic dysfunction EF 60%, slt pulm HTN   . cataract signs       A IV Location/Drains/Wounds Patient Lines/Drains/Airways Status   Active Line/Drains/Airways    Name:   Placement date:   Placement time:   Site:   Days:   Peripheral IV 09/24/18 Right Forearm   09/24/18    2128    Forearm   1   Peripheral IV 09/25/18 Left Forearm   09/25/18    0025    Forearm   less than 1          Intake/Output Last 24 hours No intake or output data in the 24 hours ending 09/25/18 0109  Labs/Imaging Results for orders placed or performed during the hospital encounter of 09/24/18 (from the past 48 hour(s))  Influenza panel by PCR (type A & B)     Status: None   Collection Time: 09/24/18  9:32 PM  Result Value Ref Range   Influenza A By PCR NEGATIVE NEGATIVE   Influenza B By PCR NEGATIVE NEGATIVE    Comment: (NOTE) The Xpert Xpress Flu assay  is intended as an aid in the diagnosis of  influenza and should not be used as a sole basis for treatment.  This  assay is FDA approved for nasopharyngeal swab specimens only. Nasal  washings and aspirates are unacceptable for Xpert Xpress Flu testing. Performed at [facility], [address] [place], [place] [address]   Comprehensive metabolic panel     Status: Abnormal   Collection Time: 09/24/18  9:40 PM  Result Value Ref Range   Sodium 134 (L) 135 - 145 mmol/L   Potassium 4.7 3.5 - 5.1 mmol/L    Comment: HEMOLYSIS AT THIS LEVEL MAY AFFECT RESULT   Chloride 107 98 - 111 mmol/L   CO2 16 (L) 22 - 32 mmol/L   Glucose, Bld 194 (H) 70 - 99 mg/dL   BUN 52 (H) 8 - 23 mg/dL   Creatinine, Ser 7.61 (H) 0.44 - 1.00 mg/dL   Calcium 9.5 8.9 -  95.0 mg/dL   Total Protein 7.5 6.5 - 8.1 g/dL   Albumin 3.7 3.5 - 5.0 g/dL   AST 52 (H) 15 - 41 U/L    Comment: HEMOLYSIS AT THIS LEVEL MAY AFFECT RESULT   ALT 21 0 - 44 U/L   Alkaline Phosphatase 79 38 - 126 U/L   Total Bilirubin 1.5 (H) 0.3 - 1.2 mg/dL    Comment: HEMOLYSIS AT THIS LEVEL MAY AFFECT RESULT   GFR calc non Af Amer 28 (L) >60 mL/min   GFR calc Af Amer 33 (L) >60 mL/min   Anion gap 11 5 - 15    Comment: Performed at [facility], [address] [place], [place] [address]  CBC with Differential     Status: Abnormal   Collection Time: 09/24/18  9:40 PM  Result Value Ref Range   WBC 8.0 4.0 - 10.5 K/uL   RBC 4.01 3.87 - 5.11 MIL/uL   Hemoglobin 12.7 12.0 - 15.0 g/dL   HCT 12.4 58.0 - 99.8 %   MCV 93.3 80.0 - 100.0 fL   MCH 31.7 26.0 - 34.0 pg   MCHC 34.0 30.0 - 36.0 g/dL   RDW 33.8 25.0 - 53.9 %   Platelets 84 (L) 150 - 400 K/uL    Comment: Immature Platelet Fraction may be clinically indicated, consider ordering this additional test [id number]    nRBC 0.0 0.0 - 0.2 %   Neutrophils Relative % 82 %   Neutro Abs 6.6 1.7 - 7.7 K/uL   Lymphocytes Relative 10 %   Lymphs Abs 0.8 0.7 - 4.0 K/uL   Monocytes Relative 8 %   Monocytes Absolute 0.7 0.1 - 1.0 K/uL   Eosinophils Relative 0 %   Eosinophils Absolute 0.0 0.0 - 0.5 K/uL   Basophils Relative 0 %   Basophils Absolute 0.0 0.0 - 0.1 K/uL   Immature Granulocytes 0 %   Abs Immature Granulocytes 0.02 0.00 - 0.07 K/uL    Comment: Performed at [facility], [address] [place], [place] [address]  Troponin I - Add-On to previous collection     Status: Abnormal   Collection Time: 09/24/18  9:40 PM  Result Value Ref Range   Troponin I 0.10 (HH) <0.03 ng/mL    Comment: CRITICAL RESULT CALLED TO, READ BACK BY AND VERIFIED WITH Kristina Bennett AT 2344 09/24/2018.  TFK Performed at [facility], [address] [place], [place] [address]   Urinalysis, Complete w Microscopic     Status:  Abnormal   Collection Time:  09/24/18 11:29 PM  Result Value Ref Range   Color, Urine YELLOW (A) YELLOW   APPearance HAZY (A) CLEAR   Specific Gravity, Urine 1.015 1.005 - 1.030   pH 5.0 5.0 - 8.0   Glucose, UA NEGATIVE NEGATIVE mg/dL   Hgb urine dipstick MODERATE (A) NEGATIVE   Bilirubin Urine NEGATIVE NEGATIVE   Ketones, ur NEGATIVE NEGATIVE mg/dL   Protein, ur NEGATIVE NEGATIVE mg/dL   Nitrite NEGATIVE NEGATIVE   Leukocytes,Ua NEGATIVE NEGATIVE   RBC / HPF 0-5 0 - 5 RBC/hpf   WBC, UA 0-5 0 - 5 WBC/hpf   Bacteria, UA RARE (A) NONE SEEN   Squamous Epithelial / LPF NONE SEEN 0 - 5   Mucus PRESENT     Comment: Performed at [facility], [address] [place], [place] [address]   Dg Chest Port 1 View  Result Date: 09/25/2018 CLINICAL DATA:  Chest pain. Weakness. EXAM: PORTABLE CHEST 1 VIEW COMPARISON:  None. FINDINGS: Mild cardiomegaly. Tortuous atherosclerotic thoracic aorta. No focal airspace disease, pulmonary edema, large pleural effusion or pneumothorax. Scoliotic curvature of the spine. IMPRESSION: Mild cardiomegaly. No congestive failure or acute pulmonary process. Aortic Atherosclerosis (ICD10-I70.0). Electronically Signed   By: Narda Rutherford M.D.   On: 09/25/2018 00:24    Pending Labs Unresulted Labs (From admission, onward)    Start     Ordered   09/25/18 0800  Heparin level (unfractionated)  Once-Timed,   STAT     09/25/18 0023   09/25/18 0500  CBC  Tomorrow morning,   STAT     09/25/18 0023   09/24/18 2140  Urine culture  Once,   STAT     09/24/18 2140   Signed and Held  Troponin I - Now Then Q6H  Now then every 6 hours,   R     Signed and Held   Signed and Held  Basic metabolic panel  Tomorrow morning,   R     Signed and Held   Signed and Held  CBC  Tomorrow morning,   R     Signed and Held          Vitals/Pain Today's Vitals   09/24/18 2200 09/24/18 2245 09/24/18 2345 09/25/18 0000  BP: 129/61 (!) 127/57 136/60 (!) 152/62  Pulse: 92 81 86 85   Resp: (!) 23 18 18 18   Temp:      TempSrc:      SpO2: 98% 98% 99% 98%  Weight:      Height:      PainSc:        Isolation Precautions Droplet precaution  Medications Medications  heparin ADULT infusion 100 units/mL (25000 units/263mL sodium chloride 0.45%) (850 Units/hr Intravenous New Bag/Given 09/25/18 0014)  sodium chloride 0.9 % bolus 1,000 mL (0 mLs Intravenous Stopped 09/24/18 2348)  sodium chloride 0.9 % bolus 500 mL (500 mLs Intravenous New Bag/Given 09/24/18 2330)  heparin injection 4,000 Units (4,000 Units Intravenous Given 09/25/18 0015)    Mobility walks with device Moderate fall risk   Focused Assessments Cardiac Assessment Handoff:  Cardiac Rhythm: Sinus tachycardia Lab Results  Component Value Date   TROPONINI 0.10 (HH) 09/24/2018   No results found for: DDIMER Does the Patient currently have chest pain? No     R Recommendations: See Admitting Provider Note  Report given to:   Additional Notes: none

## 2018-09-25 NOTE — Progress Notes (Signed)
[employer] - [place] at [facility]   PATIENT NAME: Kristina Bennett    MR#:  [id number]  DATE OF BIRTH:  01/14/1931  SUBJECTIVE:  CHIEF COMPLAINT:   Chief Complaint  Patient presents with  . Weakness  . Diarrhea    No new complaint this morning.  No chest pain.  No diarrhea.  No shortness of breath.  REVIEW OF SYSTEMS:  Review of Systems  Constitutional: Negative for chills and fever.  HENT: Negative for hearing loss and tinnitus.   Eyes: Negative for blurred vision and double vision.  Respiratory: Negative for cough and shortness of breath.   Cardiovascular: Negative for chest pain and palpitations.  Gastrointestinal: Negative for abdominal pain, diarrhea, heartburn, nausea and vomiting.  Genitourinary: Negative for dysuria, frequency and urgency.  Musculoskeletal: Negative for myalgias and neck pain.  Skin: Negative for itching and rash.  Neurological: Negative for dizziness and headaches.  Psychiatric/Behavioral: Negative for depression and hallucinations.    DRUG ALLERGIES:   Allergies  Allergen Reactions  . Etodolac Shortness Of Breath and Other (See Comments)    REACTION: dizziness  . Ace Inhibitors Other (See Comments)    REACTION: unknown  . Atorvastatin Other (See Comments)    Reaction: muscle aches   . Glimepiride Other (See Comments)    Reaction: hypoglycemia  . Glipizide Other (See Comments)    REACTION: low sugar  . Metformin Other (See Comments)    REACTION: increased creatinine  . Metoprolol Tartrate Other (See Comments)    Reaction: unknown  . Propoxyphene N-Acetaminophen Other (See Comments)    Reaction: unknown  . Rosuvastatin Other (See Comments)    Reaction: muscle aches    VITALS:  Blood pressure (!) 126/57, pulse 80, temperature 98 F (36.7 C), temperature source Oral, resp. rate 18, height 5\' 2"  (1.575 m), weight 75.8 kg, SpO2 99 %. PHYSICAL EXAMINATION:    Physical Exam  Constitutional: She is oriented to person,  place, and time. She appears well-developed and well-nourished.  HENT:  Head: Normocephalic and atraumatic.  Mouth/Throat: Oropharynx is clear and moist.  Eyes: Pupils are equal, round, and reactive to light. Conjunctivae and EOM are normal. Right eye exhibits no discharge.  Neck: Normal range of motion. Neck supple. No tracheal deviation present.  Cardiovascular: Normal rate, regular rhythm and normal heart sounds.  Respiratory: Effort normal and breath sounds normal.  GI: Soft. Bowel sounds are normal. She exhibits no distension. There is no abdominal tenderness.  Musculoskeletal: Normal range of motion.        General: No edema.  Neurological: She is alert and oriented to person, place, and time. No cranial nerve deficit.  Skin: Skin is warm. She is not diaphoretic. No erythema.  Psychiatric: She has a normal mood and affect. Her behavior is normal.   LABORATORY PANEL:  Female CBC Recent Labs  Lab 09/25/18 0415  WBC 7.3  HGB 10.7*  HCT 31.2*  PLT 102*   ------------------------------------------------------------------------------------------------------------------ Chemistries  Recent Labs  Lab 09/24/18 2140 09/25/18 0415  NA 134* 136  K 4.7 3.5  CL 107 110  CO2 16* 18*  GLUCOSE 194* 126*  BUN 52* 50*  CREATININE 1.62* 1.40*  CALCIUM 9.5 8.8*  AST 52*  --   ALT 21  --   ALKPHOS 79  --   BILITOT 1.5*  --    RADIOLOGY:  Dg Chest Port 1 View  Result Date: 09/25/2018 CLINICAL DATA:  Chest pain. Weakness. EXAM: PORTABLE CHEST 1 VIEW COMPARISON:  None.  FINDINGS: Mild cardiomegaly. Tortuous atherosclerotic thoracic aorta. No focal airspace disease, pulmonary edema, large pleural effusion or pneumothorax. Scoliotic curvature of the spine. IMPRESSION: Mild cardiomegaly. No congestive failure or acute pulmonary process. Aortic Atherosclerosis (ICD10-I70.0). Electronically Signed   By: Narda RutherfordMelanie  Sanford M.D.   On: 09/25/2018 00:24   Dg Hip Unilat With Pelvis 2-3 Views  Left  Result Date: 09/25/2018 CLINICAL DATA: Left hip pain, no known injury, initial encounter EXAM: DG HIP (WITH OR WITHOUT PELVIS) 3V LEFT COMPARISON:  None. FINDINGS: The pelvic ring is intact. No acute fracture orDislocation in the left hip is noted. Mild degenerative changes of the hip joints are seen bilaterally. No soft tissue abnormality is noted. IMPRESSION: Degenerative change without acute abnormality. Electronically Signed   By: Alcide CleverMark  Lukens M.D.   On: 09/25/2018 11:36   ASSESSMENT AND PLAN:   1.  Elevated troponin - Patient denies any chest pains.  No shortness of breath.  Evaluated by cardiologist this morning.  Elevated troponin felt to be due to demand ischemia.  Does not appear to be acute coronary syndrome given presenting scenario.  Troponin level remains fairly flat with most recent troponin of 0.11.   Patient was started on heparin drip last night.  Cardiologist discontinued heparin drip this morning.  Further recommendation will be based on findings on echocardiogram.   2D echocardiogram done this morning revealed ejection fraction of 60 to 65%.  Cavity size is normal.  Evidence of impaired relaxation noted.  2. Acute on chronic kidney failure (HCC)  Renal function improving with IV fluids.  Follow-up BMP in a.m.  3. Left hip pain - X-ray of the left hip done revealed degenerative change without acute abnormalities.  Symptomatic management.   4.  Diabetes type 2, controlled  -sliding scale insulin coverage  5.  Essential hypertension -blood pressure controlled.  Continue home meds  6. HYPERCHOLESTEROLEMIA -home dose antilipid  DVT prophylaxis; placed on subacute heparin since heparin drip already discontinued   All the records are reviewed and case discussed with Care Management/Social Worker. Management plans discussed with the patient, family and they are in agreement.  CODE STATUS: Full Code  TOTAL TIME TAKING CARE OF THIS PATIENT: 28 minutes.   More than 50%  of the time was spent in counseling/coordination of care: YES  POSSIBLE D/C IN 1 DAY, DEPENDING ON CLINICAL CONDITION.   Dyamond Tolosa M.D on 09/25/2018 at 12:11 PM  Between 7am to 6pm - Pager - [phone]  After 6pm go to [email or web address] - Scientist, research (life sciences)password [email or web address]  [facility]  Office  [phone]  CC: Primary care physician; Tower, Audrie GallusMarne A, MD  Note: This dictation was prepared with Dragon dictation along with smaller phrase technology. Any transcriptional errors that result from this process are unintentional.

## 2018-09-25 NOTE — H&P (Signed)
[place] at [place] NAME: Kristina Bennett    MR#:  [id number]  DATE OF BIRTH:  Sep 07, 1930  DATE OF ADMISSION:  09/24/2018  PRIMARY CARE PHYSICIAN: Tower, Wynelle Fanny, MD   REQUESTING/REFERRING PHYSICIAN: Beather Arbour, MD  CHIEF COMPLAINT:   Chief Complaint  Patient presents with  . Weakness  . Diarrhea    HISTORY OF PRESENT ILLNESS:  Kristina Bennett  is a 85 y.o. female who presents with chief complaint as above.  Patient presents to the ED with a complaint of several days of fatigue.  She is a poor historian due to mild dementia and HPI is given mostly by her daughter.  Daughter states the patient did not complain at any point of any chest pain or shortness of breath.  However, on evaluation here in the ED she is noted to have an elevated troponin, and some acute on chronic renal failure.  Hospitalist were called for admission  PAST MEDICAL HISTORY:   Past Medical History:  Diagnosis Date  . Anemia   . Anemia   . Diabetes mellitus   . DM2 (diabetes mellitus, type 2) ([place])   . HTN (hypertension)   . Kidney congenitally absent, left    small  . Macular degeneration   . Macular degeneration   . Nephrolithiasis    hx  . Renal insufficiency    poss renal vasc dz  . Renal insufficiency   . Rheumatoid arthritis(714.0)   . Rheumatoid arthritis(714.0)   . Umbilical hernia   . Urinary incontinence      PAST SURGICAL HISTORY:   Past Surgical History:  Procedure Laterality Date  . 411 echo     mild diastolic dysfunction EF 79%, slt pulm HTN   . cataract signs       SOCIAL HISTORY:   Social History   Tobacco Use  . Smoking status: Never Smoker  . Smokeless tobacco: Never Used  Substance Use Topics  . Alcohol use: No    Alcohol/week: 0.0 standard drinks     FAMILY HISTORY:   Family History  Problem Relation Age of Onset  . Heart attack Mother   . Prostate cancer Father      DRUG ALLERGIES:   Allergies  Allergen  Reactions  . Etodolac Shortness Of Breath and Other (See Comments)    REACTION: dizziness  . Ace Inhibitors Other (See Comments)    REACTION: unknown  . Atorvastatin Other (See Comments)    Reaction: muscle aches   . Glimepiride Other (See Comments)    Reaction: hypoglycemia  . Glipizide Other (See Comments)    REACTION: low sugar  . Metformin Other (See Comments)    REACTION: increased creatinine  . Metoprolol Tartrate Other (See Comments)    Reaction: unknown  . Propoxyphene N-Acetaminophen Other (See Comments)    Reaction: unknown  . Rosuvastatin Other (See Comments)    Reaction: muscle aches     MEDICATIONS AT HOME:   Prior to Admission medications   Medication Sig Start Date End Date Taking? Authorizing Provider  Blood Glucose Monitoring Suppl (CONTOUR BLOOD GLUCOSE SYSTEM) W/DEVICE KIT Use to check blood sugar once daily     [provider]  cephALEXin (KEFLEX) 500 MG capsule Take 500 mg by mouth 4 (four) times daily.    [provider]  ezetimibe (ZETIA) 10 MG tablet TAKE 1 TABLET DAILY 07/25/18   Tower, Wynelle Fanny, MD  gabapentin (NEURONTIN) 300 MG capsule TAKE 1 CAPSULE TWICE A  DAY 11/12/16   Tower, Marne A, MD  glucose blood (BAYER CONTOUR TEST) test strip 1 each by Other route daily. Use as instructed     [provider]  losartan-hydrochlorothiazide (HYZAAR) 100-12.5 MG tablet TAKE 1 TABLET BY MOUTH DAILY 05/25/18   Tower, Wynelle Fanny, MD  mupirocin ointment (BACTROBAN) 2 % Apply 1 application topically 2 (two) times daily. To affected area on foot 05/01/14   Tower, Roque Lias A, MD  silver sulfADIAZINE (SILVADENE) 1 % cream Apply 1 application topically daily. 03/28/15   Trula Slade, DPM  tolterodine (DETROL LA) 4 MG 24 hr capsule TAKE 1 CAPSULE DAILY 07/29/18   Tower, Wynelle Fanny, MD    REVIEW OF SYSTEMS:  Review of Systems  Constitutional: Positive for malaise/fatigue. Negative for chills, fever and weight loss.  HENT: Negative for ear pain, hearing  loss and tinnitus.   Eyes: Negative for blurred vision, double vision, pain and redness.  Respiratory: Negative for cough, hemoptysis and shortness of breath.   Cardiovascular: Negative for chest pain, palpitations, orthopnea and leg swelling.  Gastrointestinal: Negative for abdominal pain, constipation, diarrhea, nausea and vomiting.  Genitourinary: Negative for dysuria, frequency and hematuria.  Musculoskeletal: Negative for back pain, joint pain and neck pain.  Skin:       No acne, rash, or lesions  Neurological: Positive for weakness. Negative for dizziness, tremors and focal weakness.  Endo/Heme/Allergies: Negative for polydipsia. Does not bruise/bleed easily.  Psychiatric/Behavioral: Negative for depression. The patient is not nervous/anxious and does not have insomnia.      VITAL SIGNS:   Vitals:   09/24/18 2200 09/24/18 2245 09/24/18 2345 09/25/18 0000  BP: 129/61 (!) 127/57 136/60 (!) 152/62  Pulse: 92 81 86 85  Resp: (!) 23 18 18 18   Temp:      TempSrc:      SpO2: 98% 98% 99% 98%  Weight:      Height:       Wt Readings from Last 3 Encounters:  09/24/18 83.5 kg  06/22/17 78.2 kg  03/10/17 79.6 kg    PHYSICAL EXAMINATION:  Physical Exam  Vitals reviewed. Constitutional: She is oriented to person, place, and time. She appears well-developed and well-nourished. No distress.  HENT:  Head: Normocephalic and atraumatic.  Mouth/Throat: Oropharynx is clear and moist.  Eyes: Pupils are equal, round, and reactive to light. Conjunctivae and EOM are normal. No scleral icterus.  Neck: Normal range of motion. Neck supple. No JVD present. No thyromegaly present.  Cardiovascular: Normal rate, regular rhythm and intact distal pulses. Exam reveals no gallop and no friction rub.  No murmur heard. Respiratory: Effort normal and breath sounds normal. No respiratory distress. She has no wheezes. She has no rales.  GI: Soft. Bowel sounds are normal. She exhibits no distension. There is  no abdominal tenderness.  Musculoskeletal: Normal range of motion.        General: Tenderness (Left hip) present. No edema.     Comments: No arthritis, no gout  Lymphadenopathy:    She has no cervical adenopathy.  Neurological: She is alert and oriented to person, place, and time. No cranial nerve deficit.  No dysarthria, no aphasia  Skin: Skin is warm and dry. No rash noted. No erythema.  Psychiatric: She has a normal mood and affect. Her behavior is normal. Judgment and thought content normal.    LABORATORY PANEL:   CBC Recent Labs  Lab 09/24/18 2140  WBC 8.0  HGB 12.7  HCT 37.4  PLT 84*   ------------------------------------------------------------------------------------------------------------------  Chemistries  Recent Labs  Lab 09/24/18 2140  NA 134*  K 4.7  CL 107  CO2 16*  GLUCOSE 194*  BUN 52*  CREATININE 1.62*  CALCIUM 9.5  AST 52*  ALT 21  ALKPHOS 79  BILITOT 1.5*   ------------------------------------------------------------------------------------------------------------------  Cardiac Enzymes Recent Labs  Lab 09/24/18 2140  TROPONINI 0.10*   ------------------------------------------------------------------------------------------------------------------  RADIOLOGY:  Dg Chest Port 1 View  Result Date: 09/25/2018 CLINICAL DATA:  Chest pain. Weakness. EXAM: PORTABLE CHEST 1 VIEW COMPARISON:  None. FINDINGS: Mild cardiomegaly. Tortuous atherosclerotic thoracic aorta. No focal airspace disease, pulmonary edema, large pleural effusion or pneumothorax. Scoliotic curvature of the spine. IMPRESSION: Mild cardiomegaly. No congestive failure or acute pulmonary process. Aortic Atherosclerosis (ICD10-I70.0). Electronically Signed   By: Keith Rake M.D.   On: 09/25/2018 00:24    EKG:   Orders placed or performed during the hospital encounter of 09/24/18  . EKG 12-Lead  . EKG 12-Lead  . ED EKG  . ED EKG    IMPRESSION AND PLAN:  Principal  Problem:   Elevated troponin -we will trend her cardiac enzymes tonight, get an echocardiogram and a cardiology consult.  Patient was started on heparin drip by ED physician.  Suspicion here for ACS, or potentially some heart failure. Active Problems:   Acute on chronic kidney failure (HCC) -gentle IV fluids, avoid nephrotoxins and monitor   Left hip pain -start with x-ray of left hip   Diabetes type 2, controlled (HCC) -sliding scale insulin coverage   Essential hypertension -continue home meds   HYPERCHOLESTEROLEMIA -home dose antilipid  Chart review performed and case discussed with ED provider. Labs, imaging and/or ECG reviewed by provider and discussed with patient/family. Management plans discussed with the patient and/or family.  DVT PROPHYLAXIS: Systemic anticoagulation  GI PROPHYLAXIS:  None  ADMISSION STATUS: Observation  CODE STATUS: Full  TOTAL TIME TAKING CARE OF THIS PATIENT: 40 minutes.   Ethlyn Daniels 09/25/2018, 12:27 AM  [facility]  Office  [phone]  CC: Primary care physician; Tower, Wynelle Fanny, MD  Note:  This document was prepared using Systems analyst and may include unintentional dictation errors.

## 2018-09-25 NOTE — ED Notes (Signed)
Per Dr. Anne Hahn, hold pt until he sees her.

## 2018-09-25 NOTE — Care Management Obs Status (Addendum)
MEDICARE OBSERVATION STATUS NOTIFICATION   Patient Details  Name: Kristina Bennett MRN: [id number] Date of Birth: Dec 27, 1930   Medicare Observation Status Notification Given:  Yes  Discussed with POA in person.   Hoda Hon A Yukari Flax, RN 09/25/2018, 11:00 AM

## 2018-09-26 ENCOUNTER — Observation Stay: Payer: Medicare Other

## 2018-09-26 LAB — GLUCOSE, CAPILLARY
Glucose-Capillary: 90 mg/dL (ref 70–99)
Glucose-Capillary: 135 mg/dL — ABNORMAL HIGH (ref 70–99)
Glucose-Capillary: 65 mg/dL — ABNORMAL LOW (ref 70–99)
Glucose-Capillary: 133 mg/dL — ABNORMAL HIGH (ref 70–99)

## 2018-09-26 LAB — BASIC METABOLIC PANEL
Anion gap: 6 (ref 5–15)
BUN: 46 mg/dL — ABNORMAL HIGH (ref 8–23)
CO2: 22 mmol/L (ref 22–32)
Calcium: 8.8 mg/dL — ABNORMAL LOW (ref 8.9–10.3)
Chloride: 110 mmol/L (ref 98–111)
Creatinine, Ser: 1.19 mg/dL — ABNORMAL HIGH (ref 0.44–1.00)
GFR calc Af Amer: 48 mL/min — ABNORMAL LOW (ref >60)
GFR calc non Af Amer: 41 mL/min — ABNORMAL LOW (ref >60)
Glucose, Bld: 110 mg/dL — ABNORMAL HIGH (ref 70–99)
Potassium: 3.8 mmol/L (ref 3.5–5.1)
Sodium: 138 mmol/L (ref 135–145)

## 2018-09-26 LAB — URINE CULTURE: Culture: NO GROWTH

## 2018-09-26 LAB — CBC
HCT: 34.2 % — ABNORMAL LOW (ref 36.0–46.0)
Hemoglobin: 11.5 g/dL — ABNORMAL LOW (ref 12.0–15.0)
MCH: 31.6 pg (ref 26.0–34.0)
MCHC: 33.6 g/dL (ref 30.0–36.0)
MCV: 94 fL (ref 80.0–100.0)
Platelets: 107 10*3/uL — ABNORMAL LOW (ref 150–400)
RBC: 3.64 MIL/uL — ABNORMAL LOW (ref 3.87–5.11)
RDW: 13.2 % (ref 11.5–15.5)
WBC: 6.2 10*3/uL (ref 4.0–10.5)
nRBC: 0 % (ref 0.0–0.2)

## 2018-09-26 LAB — MAGNESIUM: Magnesium: 1.7 mg/dL (ref 1.7–2.4)

## 2018-09-26 MED ORDER — LOSARTAN POTASSIUM 50 MG PO TABS
50.0000 mg | ORAL_TABLET | Freq: Every day | ORAL | Status: DC
  Administered 2018-09-26 – 2018-09-29 (×4): 50 mg via ORAL
  Filled 2018-09-26 (×4): qty 1

## 2018-09-26 MED ORDER — EZETIMIBE 10 MG PO TABS
10.0000 mg | ORAL_TABLET | Freq: Every day | ORAL | Status: DC
  Administered 2018-09-26 – 2018-09-29 (×4): 10 mg via ORAL
  Filled 2018-09-26 (×4): qty 1

## 2018-09-26 NOTE — Progress Notes (Signed)
[employer] -  at [facility]   PATIENT NAME: Kristina Bennett    MR#:  [id number]  DATE OF BIRTH:  May 31, 1931  SUBJECTIVE:  CHIEF COMPLAINT:   Chief Complaint  Patient presents with  . Weakness  . Diarrhea    No new complaint this morning.  No chest pain.  No diarrhea.  No shortness of breath.  REVIEW OF SYSTEMS:  Review of Systems  Constitutional: Negative for chills and fever.  HENT: Negative for hearing loss and tinnitus.   Eyes: Negative for blurred vision and double vision.  Respiratory: Negative for cough and shortness of breath.   Cardiovascular: Negative for chest pain and palpitations.  Gastrointestinal: Negative for abdominal pain, diarrhea, heartburn, nausea and vomiting.  Genitourinary: Negative for dysuria, frequency and urgency.  Musculoskeletal: Negative for myalgias and neck pain.  Skin: Negative for itching and rash.  Neurological: Negative for dizziness and headaches.  Psychiatric/Behavioral: Negative for depression and hallucinations.    DRUG ALLERGIES:   Allergies  Allergen Reactions  . Etodolac Shortness Of Breath and Other (See Comments)    REACTION: dizziness  . Ace Inhibitors Other (See Comments)    REACTION: unknown  . Atorvastatin Other (See Comments)    Reaction: muscle aches   . Glimepiride Other (See Comments)    Reaction: hypoglycemia  . Glipizide Other (See Comments)    REACTION: low sugar  . Metformin Other (See Comments)    REACTION: increased creatinine  . Metoprolol Tartrate Other (See Comments)    Reaction: unknown  . Propoxyphene N-Acetaminophen Other (See Comments)    Reaction: unknown  . Rosuvastatin Other (See Comments)    Reaction: muscle aches    VITALS:  Blood pressure 138/60, pulse 71, temperature 98.3 F (36.8 C), temperature source Oral, resp. rate 18, height 5\' 2"  (1.575 m), weight 75.8 kg, SpO2 100 %. PHYSICAL EXAMINATION:    Physical Exam  Constitutional: She is oriented to person,  place, and time. She appears well-developed and well-nourished.  HENT:  Head: Normocephalic and atraumatic.  Mouth/Throat: Oropharynx is clear and moist.  Eyes: Pupils are equal, round, and reactive to light. Conjunctivae and EOM are normal. Right eye exhibits no discharge.  Neck: Normal range of motion. Neck supple. No tracheal deviation present.  Cardiovascular: Normal rate, regular rhythm and normal heart sounds.  Respiratory: Effort normal and breath sounds normal.  GI: Soft. Bowel sounds are normal. She exhibits no distension. There is no abdominal tenderness.  Musculoskeletal: Normal range of motion.        General: No edema.  Neurological: She is alert and oriented to person, place, and time. No cranial nerve deficit.  Skin: Skin is warm. She is not diaphoretic. No erythema.  Psychiatric: She has a normal mood and affect. Her behavior is normal.   LABORATORY PANEL:  Female CBC Recent Labs  Lab 09/26/18 0531  WBC 6.2  HGB 11.5*  HCT 34.2*  PLT 107*   ------------------------------------------------------------------------------------------------------------------ Chemistries  Recent Labs  Lab 09/24/18 2140  09/26/18 0531  NA 134*   < > 138  K 4.7   < > 3.8  CL 107   < > 110  CO2 16*   < > 22  GLUCOSE 194*   < > 110*  BUN 52*   < > 46*  CREATININE 1.62*   < > 1.19*  CALCIUM 9.5   < > 8.8*  MG  --   --  1.7  AST 52*  --   --  ALT 21  --   --   ALKPHOS 79  --   --   BILITOT 1.5*  --   --    < > = values in this interval not displayed.   RADIOLOGY:  No results found. ASSESSMENT AND PLAN:   1.  Elevated troponin - Patient denies any chest pains.  No shortness of breath.  Evaluated by cardiologist this morning.  Elevated troponin felt to be due to demand ischemia.  Does not appear to be acute coronary syndrome given presenting scenario.  Troponin level remains fairly flat with most recent troponin of 0.11.   Patient was started on heparin drip previously.   Cardiologist discontinued heparin drip previously this morning.  Further recommendation will be based on findings on echocardiogram.   2D echocardiogram done revealed ejection fraction of 60 to 65%.  Cavity size is normal.  Evidence of impaired relaxation noted.  No further work-up recommended by cardiologist  2. Acute on chronic kidney failure (HCC)  Resolved with IV fluid hydration  3. Left hip pain - X-ray of the left hip done revealed degenerative change without acute abnormalities.  Symptomatic management.  This morning patient ambulated about 15 feet with physical therapy using rolling walker. I discussed with patient's daughter Ms. Kristina Bennett who is also healthcare power of attorney.  She stated the primary reason for bringing patient to the hospital is generalized weakness and patient having difficulty with ambulation.  Patient will benefit from short-term rehabilitation prior to going back home with family. Requested for CT head without contrast to rule out any acute neurological process. Case manager to assist with short-term rehab placement.  4.  Diabetes type 2, controlled  -sliding scale insulin coverage  5.  Essential hypertension -blood pressure controlled.  Continue home meds  6. HYPERCHOLESTEROLEMIA -home dose antilipid  DVT prophylaxis; heparin  All the records are reviewed and case discussed with Care Management/Social Worker. Management plans discussed with the patient, family and they are in agreement.  CODE STATUS: Full Code  TOTAL TIME TAKING CARE OF THIS PATIENT: .   More than 50% of the time was spent in counseling/coordination of care: YES  POSSIBLE D/C IN 1 DAY, DEPENDING ON CLINICAL CONDITION.   Ayn Domangue M.D on 09/26/2018 at 2:58 PM  Between 7am to 6pm - Pager - [phone]  After 6pm go to [email or web address] - Scientist, research (life sciences) [facility]  Office  [phone]  CC: Primary care physician; Tower, Kristina Gallus, Kristina Bennett  Note:  This dictation was prepared with Dragon dictation along with smaller phrase technology. Any transcriptional errors that result from this process are unintentional.

## 2018-09-26 NOTE — Evaluation (Signed)
Physical Therapy Evaluation Patient Details Name: NEOLA DOCKWEILER MRN: [id number] DOB: [birth year] Today's Date: 09/26/2018   History of Present Illness  Patient is an 85 year old female admitted from home with reports of weakness and diarrhea.  PMH to include: anemia, DM, HTN, macular degeneration, RA.     Clinical Impression  Patient received in bed, reports she needs to go to the bathroom, pushed the button 15 minutes ago. Assisted patient to [facility] with min assist and cues for hand placement for safety. Patient able to ambulate 15 feet with RW and min guard assist. Slow, steady pace. Patient reports B LE aching with weight bearing. Fatigued with this distance. Patient will benefit from continued skilled PT to address her weakness and functional limitations.     Follow Up Recommendations SNF;Home health PT;Supervision/Assistance - 24 hour(depending on progress)    Equipment Recommendations  3in1 (PT)    Recommendations for Other Services OT consult     Precautions / Restrictions Precautions Precautions: Fall Restrictions Weight Bearing Restrictions: No      Mobility  Bed Mobility Overal bed mobility: Modified Independent             General bed mobility comments: increased time, use of bed rails  Transfers Overall transfer level: Needs assistance Equipment used: Rolling walker (2 wheeled) Transfers: Sit to/from Stand Sit to Stand: Min assist         General transfer comment: increased time needed  Ambulation/Gait Ambulation/Gait assistance: Min guard Gait Distance (Feet): 15 Feet Assistive device: Rolling walker (2 wheeled) Gait Pattern/deviations: Step-to pattern;Decreased stride length;Shuffle Gait velocity: decreased      Stairs            Wheelchair Mobility    Modified Rankin (Stroke Patients Only)       Balance Overall balance assessment: Modified Independent                                           Pertinent  Vitals/Pain Pain Assessment: Faces Faces Pain Scale: Hurts a little bit Pain Location: B LEs Pain Descriptors / Indicators: Aching Pain Intervention(s): Limited activity within patient's tolerance;Monitored during session    Home Living Family/patient expects to be discharged to:: Private residence Living Arrangements: Children(lives with son) Available Help at Discharge: Family;Available 24 hours/day;Available PRN/intermittently(son available 24 hours a day, but is unable to provide physical assistance. Other family memebers available intermittently. ) Type of Home: House Home Access: Ramped entrance     Home Layout: One level Home Equipment: Walker - 2 wheels      Prior Function Level of Independence: Independent         Comments: patient was ambulating in home with assistance from furniture.      Hand Dominance        Extremity/Trunk Assessment   Upper Extremity Assessment Upper Extremity Assessment: Generalized weakness    Lower Extremity Assessment Lower Extremity Assessment: Generalized weakness    Cervical / Trunk Assessment Cervical / Trunk Assessment: Kyphotic  Communication   Communication: No difficulties;HOH  Cognition Arousal/Alertness: Awake/alert Behavior During Therapy: WFL for tasks assessed/performed Overall Cognitive Status: History of cognitive impairments - at baseline                                        General Comments  Exercises     Assessment/Plan    PT Assessment Patient needs continued PT services  PT Problem List Decreased strength;Decreased cognition;Pain;Decreased mobility;Decreased knowledge of use of DME;Decreased activity tolerance       PT Treatment Interventions DME instruction;Functional mobility training;Patient/family education;Gait training;Therapeutic activities;Neuromuscular re-education;Therapeutic exercise    PT Goals (Current goals can be found in the Care Plan section)  Acute Rehab  PT Goals Patient Stated Goal: duaghter wants her to go to rehab due to lack of caregiver support, she wants to go home PT Goal Formulation: With patient/family Time For Goal Achievement: 10/03/18 Potential to Achieve Goals: Fair    Frequency Min 2X/week   Barriers to discharge Decreased caregiver support son is not able to assist her physically, he cooks and giver her medications at baseline    Co-evaluation               AM-PAC PT "6 Clicks" Mobility  Outcome Measure Help needed turning from your back to your side while in a flat bed without using bedrails?: A Little Help needed moving from lying on your back to sitting on the side of a flat bed without using bedrails?: A Little Help needed moving to and from a bed to a chair (including a wheelchair)?: A Little Help needed standing up from a chair using your arms (e.g., wheelchair or bedside chair)?: A Little Help needed to walk in hospital room?: A Little Help needed climbing 3-5 steps with a railing? : A Lot 6 Click Score: 17    End of Session Equipment Utilized During Treatment: Gait belt Activity Tolerance: Patient tolerated treatment well;Patient limited by fatigue Patient left: in bed;with bed alarm set;with family/visitor present Nurse Communication: Mobility status PT Visit Diagnosis: Muscle weakness (generalized) (M62.81);Difficulty in walking, not elsewhere classified (R26.2);Pain Pain - Right/Left: Right(bilateral) Pain - part of body: Leg    Time: 5027-7412 PT Time Calculation (min) (ACUTE ONLY): 21 min   Charges:   PT Evaluation $PT Eval Moderate Complexity: 1 Mod PT Treatments $Gait Training: 8-22 mins        Lacreshia Bondarenko, PT, GCS 09/26/18,2:05 PM

## 2018-09-27 LAB — GLUCOSE, CAPILLARY
Glucose-Capillary: 110 mg/dL — ABNORMAL HIGH (ref 70–99)
Glucose-Capillary: 124 mg/dL — ABNORMAL HIGH (ref 70–99)
Glucose-Capillary: 137 mg/dL — ABNORMAL HIGH (ref 70–99)
Glucose-Capillary: 117 mg/dL — ABNORMAL HIGH (ref 70–99)

## 2018-09-27 MED ORDER — POLYETHYLENE GLYCOL 3350 17 G PO PACK
17.0000 g | PACK | Freq: Every day | ORAL | Status: DC
  Administered 2018-09-27 – 2018-09-29 (×3): 17 g via ORAL
  Filled 2018-09-27 (×3): qty 1

## 2018-09-27 NOTE — Clinical Social Work Note (Signed)
CSW spoke with patient's daughter regarding SNF placements.  Patient's daughter would like a SNF close to where she lives in [place], [place].  CSW explained to her about the process of looking for placement, and how insurance will pay for stay.  CSW informed her that CSW will have to get insurance approval, and also find a facility that can accept patient.  CSW informed her that insurance only allows for a 50 mile radius, so she may have to pay more if going out of county.  Patient's daughter expressed understanding.  CSW was given permission to begin bed search in [place] and [place].  Formal assessment to follow, CSW to begin bed search and will follow up with patient's daughter on Wednesday.  Patient's daughter Bonita Quin requested that CSW leave a list of SNFs in the patient's room, CSW to put list in patient's room.   Ervin Knack. Hassan Rowan, MSW, LCSW [phone]  09/27/2018 6:37 PM

## 2018-09-27 NOTE — Progress Notes (Signed)
Patient Name: Kristina Bennett Date of Encounter: 09/27/2018  Hospital Problem List     Principal Problem:   Elevated troponin Active Problems:   Diabetes type 2, controlled (HCC)   HYPERCHOLESTEROLEMIA   Essential hypertension   Acute on chronic kidney failure [facility])    Patient Profile     85 year old female with history of diabetes, hypertension, acute on chronic renal insufficiency admitted with progressive anorexia, diarrhea and proximal muscle weakness.  Had a mild serum troponin elevation on presentation.  This was demand ischemia.  Subjective   Still somewhat confused but no complaints this morning.  Inpatient Medications    . ezetimibe  10 mg Oral Daily  . heparin injection (subcutaneous)  5,000 Units Subcutaneous Q8H  . insulin aspart  0-5 Units Subcutaneous QHS  . insulin aspart  0-9 Units Subcutaneous TID WC  . losartan  50 mg Oral Daily    Vital Signs    Vitals:   09/26/18 1554 09/26/18 1930 09/27/18 0402 09/27/18 0736  BP: (!) 142/63 (!) 145/52 (!) 130/46 (!) 129/49  Pulse: 73 91 79 75  Resp: 18   19  Temp: 98 F (36.7 C) 98.4 F (36.9 C) 98.4 F (36.9 C) 97.9 F (36.6 C)  TempSrc: Oral Oral Oral Oral  SpO2: 100% 100% 98% 98%  Weight:      Height:        Intake/Output Summary (Last 24 hours) at 09/27/2018 0804 Last data filed at 09/27/2018 0217 Gross per 24 hour  Intake 240 ml  Output 500 ml  Net -260 ml   Filed Weights   09/24/18 2121 09/25/18 0157  Weight: 83.5 kg 75.8 kg    Physical Exam    GEN: Well nourished, well developed, in no acute distress.  HEENT: normal.  Neck: Supple, no JVD, carotid bruits, or masses. Cardiac: RRR, no murmurs, rubs, or gallops. No clubbing, cyanosis, edema.  Radials/DP/PT 2+ and equal bilaterally.  Respiratory:  Respirations regular and unlabored, clear to auscultation bilaterally. GI: Soft, nontender, nondistended, BS + x 4. MS: no deformity or atrophy. Skin: warm and dry, no rash. Neuro:  Strength  and sensation are intact. Psych: Normal affect.  Labs    CBC Recent Labs    09/24/18 2140 09/25/18 0415 09/26/18 0531  WBC 8.0 7.3 6.2  NEUTROABS 6.6  --   --   HGB 12.7 10.7* 11.5*  HCT 37.4 31.2* 34.2*  MCV 93.3 92.0 94.0  PLT 84* 102* 107*   Basic Metabolic Panel Recent Labs    46/56/81 0415 09/26/18 0531  NA 136 138  K 3.5 3.8  CL 110 110  CO2 18* 22  GLUCOSE 126* 110*  BUN 50* 46*  CREATININE 1.40* 1.19*  CALCIUM 8.8* 8.8*  MG  --  1.7   Liver Function Tests Recent Labs    09/24/18 2140  AST 52*  ALT 21  ALKPHOS 79  BILITOT 1.5*  PROT 7.5  ALBUMIN 3.7   No results for input(s): LIPASE, AMYLASE in the last 72 hours. Cardiac Enzymes Recent Labs    09/24/18 2140 09/25/18 0415 09/25/18 0835  TROPONINI 0.10* 0.12* 0.11*   BNP No results for input(s): BNP in the last 72 hours. D-Dimer No results for input(s): DDIMER in the last 72 hours. Hemoglobin A1C No results for input(s): HGBA1C in the last 72 hours. Fasting Lipid Panel No results for input(s): CHOL, HDL, LDLCALC, TRIG, CHOLHDL, LDLDIRECT in the last 72 hours. Thyroid Function Tests No results for input(s): TSH, T4TOTAL,  T3FREE, THYROIDAB in the last 72 hours.  Invalid input(s): FREET3  Telemetry    Normal sinus rhythm  ECG    Sinus rhythm with no ischemia  Radiology    Ct Head Wo Contrast  Result Date: 09/26/2018 CLINICAL DATA:  Weakness and difficulty walking EXAM: CT HEAD WITHOUT CONTRAST TECHNIQUE: Contiguous axial images were obtained from the base of the skull through the vertex without intravenous contrast. COMPARISON:  None. FINDINGS: Brain: There is no mass, hemorrhage or extra-axial collection. The size and configuration of the ventricles and extra-axial CSF spaces are normal. The brain parenchyma is normal, without acute or chronic infarction. Vascular: Atherosclerotic calcification of the vertebral and internal carotid arteries at the skull base. No abnormal hyperdensity of  the major intracranial arteries or dural venous sinuses. Skull: The visualized skull base, calvarium and extracranial soft tissues are normal. Sinuses/Orbits: No fluid levels or advanced mucosal thickening of the visualized paranasal sinuses. No mastoid or middle ear effusion. The orbits are normal. IMPRESSION: 1. Normal aging brain. 2. Intracranial atherosclerosis. Electronically Signed   By: Deatra Robinson M.D.   On: 09/26/2018 15:59   Dg Chest Port 1 View  Result Date: 09/25/2018 CLINICAL DATA:  Chest pain. Weakness. EXAM: PORTABLE CHEST 1 VIEW COMPARISON:  None. FINDINGS: Mild cardiomegaly. Tortuous atherosclerotic thoracic aorta. No focal airspace disease, pulmonary edema, large pleural effusion or pneumothorax. Scoliotic curvature of the spine. IMPRESSION: Mild cardiomegaly. No congestive failure or acute pulmonary process. Aortic Atherosclerosis (ICD10-I70.0). Electronically Signed   By: Narda Rutherford M.D.   On: 09/25/2018 00:24   Dg Hip Unilat With Pelvis 2-3 Views Left  Result Date: 09/25/2018 CLINICAL DATA: Left hip pain, no known injury, initial encounter EXAM: DG HIP (WITH OR WITHOUT PELVIS) 3V LEFT COMPARISON:  None. FINDINGS: The pelvic ring is intact. No acute fracture orDislocation in the left hip is noted. Mild degenerative changes of the hip joints are seen bilaterally. No soft tissue abnormality is noted. IMPRESSION: Degenerative change without acute abnormality. Electronically Signed   By: Alcide Clever M.D.   On: 09/25/2018 11:36    Assessment & Plan    85 year old female admitted with altered mental status, anorexia and acute on chronic renal insufficiency.  Improving with hydration.  Elevated troponin-appears to be demand ischemia.  There is no evidence of acute coronary syndrome.  Would continue with current regimen.  Patient appears hemodynamically stable.  Okay for discharge from cardiac standpoint.  Renal insufficiency-creatinine has improved to 1.19 which appears near  baseline.  Altered mental status-brain CT revealed no intracranial acute process.  Signed, Darlin Priestly Riordan Walle MD 09/27/2018, 8:04 AM  Pager: [phone]

## 2018-09-27 NOTE — Progress Notes (Signed)
[facility] [facility]   PATIENT NAME: Kristina Bennett    MR#:  [id number]  DATE OF BIRTH:  [birth year]  SUBJECTIVE:  CHIEF COMPLAINT:   Chief Complaint  Patient presents with  . Weakness  . Diarrhea    No new complaint this morning.  No chest pain.  No diarrhea.  No shortness of breath.  No fevers.  REVIEW OF SYSTEMS:  Review of Systems  Constitutional: Negative for chills and fever.  HENT: Negative for hearing loss and tinnitus.   Eyes: Negative for blurred vision and double vision.  Respiratory: Negative for cough and shortness of breath.   Cardiovascular: Negative for chest pain and palpitations.  Gastrointestinal: Negative for abdominal pain, diarrhea, heartburn, nausea and vomiting.  Genitourinary: Negative for dysuria, frequency and urgency.  Musculoskeletal: Negative for myalgias and neck pain.  Skin: Negative for itching and rash.  Neurological: Negative for dizziness and headaches.  Psychiatric/Behavioral: Negative for depression and hallucinations.    DRUG ALLERGIES:   Allergies  Allergen Reactions  . Etodolac Shortness Of Breath and Other (See Comments)    REACTION: dizziness  . Ace Inhibitors Other (See Comments)    REACTION: unknown  . Atorvastatin Other (See Comments)    Reaction: muscle aches   . Glimepiride Other (See Comments)    Reaction: hypoglycemia  . Glipizide Other (See Comments)    REACTION: low sugar  . Metformin Other (See Comments)    REACTION: increased creatinine  . Metoprolol Tartrate Other (See Comments)    Reaction: unknown  . Propoxyphene N-Acetaminophen Other (See Comments)    Reaction: unknown  . Rosuvastatin Other (See Comments)    Reaction: muscle aches    VITALS:  Blood pressure (!) 129/49, pulse 75, temperature 97.9 F (36.6 C), temperature source Oral, resp. rate 19, height 5\' 2"  (1.575 m), weight 75.8 kg, SpO2 98 %. PHYSICAL EXAMINATION:    Physical Exam  Constitutional: She is  oriented to person, place, and time. She appears well-developed and well-nourished.  HENT:  Head: Normocephalic and atraumatic.  Mouth/Throat: Oropharynx is clear and moist.  Eyes: Pupils are equal, round, and reactive to light. Conjunctivae and EOM are normal. Right eye exhibits no discharge.  Neck: Normal range of motion. Neck supple. No tracheal deviation present.  Cardiovascular: Normal rate, regular rhythm and normal heart sounds.  Respiratory: Effort normal and breath sounds normal.  GI: Soft. Bowel sounds are normal. She exhibits no distension. There is no abdominal tenderness.  Musculoskeletal: Normal range of motion.        General: No edema.  Neurological: She is alert and oriented to person, place, and time. No cranial nerve deficit.  Skin: Skin is warm. She is not diaphoretic. No erythema.  Psychiatric: She has a normal mood and affect. Her behavior is normal.   LABORATORY PANEL:  Female CBC Recent Labs  Lab 09/26/18 0531  WBC 6.2  HGB 11.5*  HCT 34.2*  PLT 107*   ------------------------------------------------------------------------------------------------------------------ Chemistries  Recent Labs  Lab 09/24/18 2140  09/26/18 0531  NA 134*   < > 138  K 4.7   < > 3.8  CL 107   < > 110  CO2 16*   < > 22  GLUCOSE 194*   < > 110*  BUN 52*   < > 46*  CREATININE 1.62*   < > 1.19*  CALCIUM 9.5   < > 8.8*  MG  --   --  1.7  AST 52*  --   --  ALT 21  --   --   ALKPHOS 79  --   --   BILITOT 1.5*  --   --    < > = values in this interval not displayed.   RADIOLOGY:  Ct Head Wo Contrast  Result Date: 09/26/2018 CLINICAL DATA:  Weakness and difficulty walking EXAM: CT HEAD WITHOUT CONTRAST TECHNIQUE: Contiguous axial images were obtained from the base of the skull through the vertex without intravenous contrast. COMPARISON:  None. FINDINGS: Brain: There is no mass, hemorrhage or extra-axial collection. The size and configuration of the ventricles and extra-axial CSF  spaces are normal. The brain parenchyma is normal, without acute or chronic infarction. Vascular: Atherosclerotic calcification of the vertebral and internal carotid arteries at the skull base. No abnormal hyperdensity of the major intracranial arteries or dural venous sinuses. Skull: The visualized skull base, calvarium and extracranial soft tissues are normal. Sinuses/Orbits: No fluid levels or advanced mucosal thickening of the visualized paranasal sinuses. No mastoid or middle ear effusion. The orbits are normal. IMPRESSION: 1. Normal aging brain. 2. Intracranial atherosclerosis. Electronically Signed   By: Deatra Robinson M.D.   On: 09/26/2018 15:59   ASSESSMENT AND PLAN:   1.  Elevated troponin -secondary to demand ischemia Patient denies any chest pains.  No shortness of breath.  Evaluated by cardiologist.  Appreciate input. Does not appear to be acute coronary syndrome given presenting scenario.  Troponin level remains fairly flat with most recent troponin of 0.11.   Patient was started on heparin drip previously.  Cardiologist discontinued heparin drip previously .2D echocardiogram done revealed ejection fraction of 60 to 65%.  Cavity size is normal.  Evidence of impaired relaxation noted.  No further work-up recommended by cardiologist  2. Acute on chronic kidney failure  Resolved with IV fluid hydration  3. Left hip pain - X-ray of the left hip done revealed degenerative change without acute abnormalities.  Symptomatic management.  This morning patient ambulated about 15 feet with physical therapy using rolling walker. I discussed with patient's daughter Ms. Bonita Quin who is also healthcare power of attorney.  She stated the primary reason for bringing patient to the hospital is generalized weakness and patient having difficulty with ambulation.  Patient will benefit from short-term rehabilitation prior to going back home with family to help with getting patient back to prior level of functioning.   Patient was able to ambulate independently prior to admission.CT scan of the head done with no acute findings. Patient evaluated by physical therapist.  Recommendation is for skilled nursing facility placement.  Case manager assisting with placement  4.  Diabetes type 2, controlled  -sliding scale insulin coverage  5.  Essential hypertension -blood pressure controlled.  Continue home meds  6. HYPERCHOLESTEROLEMIA -home dose antilipid  DVT prophylaxis; heparin  All the records are reviewed and case discussed with Care Management/Social Worker. Management plans discussed with the patient, family and they are in agreement.  CODE STATUS: Full Code  TOTAL TIME TAKING CARE OF THIS PATIENT: .   More than 50% of the time was spent in counseling/coordination of care: YES  POSSIBLE D/C IN 1-2 DAY, DEPENDING ON CLINICAL CONDITION.   Mazi Brailsford M.D on 09/27/2018 at 2:37 PM  Between 7am to 6pm - Pager - [phone]  After 6pm go to [email or web address] - Scientist, research (life sciences) [facility]  Office  [phone]  CC: Primary care physician; Tower, Audrie Gallus, MD  Note: This dictation was prepared with Dragon dictation along with  smaller phrase technology. Any transcriptional errors that result from this process are unintentional. 

## 2018-09-27 NOTE — Progress Notes (Signed)
Physical Therapy Treatment Patient Details Name: Kristina Bennett MRN: [id number] DOB: 1930-10-08 Today's Date: 09/27/2018    History of Present Illness Patient is an 85 year old female admitted from home with reports of weakness and diarrhea.  PMH to include: anemia, DM, HTN, macular degeneration, RA.       PT Comments    Patient received up in recliner, multiple family members present. Patient agrees to PT. Patient requires min assist to stand from recliner. Able to ambulate 30 feet with rw and min assist. Cues to stay close to AD, and for safety when trying to return to recliner. Patient demonstrates slow cadence with significant antalgic gait pattern due to left hip pain with weight bearing. Patient will benefit from continued skilled PT while here and going forward to improve strength in LEs, improve safety with mobility and functional independence.       Follow Up Recommendations  SNF     Equipment Recommendations  None recommended by PT    Recommendations for Other Services       Precautions / Restrictions Precautions Precautions: Fall Restrictions Weight Bearing Restrictions: No    Mobility  Bed Mobility               General bed mobility comments: Not assessed this day, patient in recliner   Transfers Overall transfer level: Needs assistance Equipment used: Rolling walker (2 wheeled) Transfers: Sit to/from Stand Sit to Stand: Min assist            Ambulation/Gait Ambulation/Gait assistance: Min assist Gait Distance (Feet): 30 Feet Assistive device: Rolling walker (2 wheeled) Gait Pattern/deviations: Decreased stance time - left;Antalgic;Narrow base of support;Shuffle;Step-to pattern;Trunk flexed Gait velocity: decreased   General Gait Details: patient ambulates with antalgic gait pattern due to left hip pain. Decreased safety with ambulation   Stairs             Wheelchair Mobility    Modified Rankin (Stroke Patients Only)        Balance Overall balance assessment: Needs assistance         Standing balance support: Bilateral upper extremity supported Standing balance-Leahy Scale: Fair                              Cognition Arousal/Alertness: Awake/alert Behavior During Therapy: WFL for tasks assessed/performed Overall Cognitive Status: History of cognitive impairments - at baseline                                        Exercises Total Joint Exercises Ankle Circles/Pumps: AROM;10 reps;Both Heel Slides: AAROM;10 reps;Both Hip ABduction/ADduction: AAROM;10 reps;Both Straight Leg Raises: AAROM;5 reps;Both    General Comments        Pertinent Vitals/Pain Pain Assessment: Faces Faces Pain Scale: Hurts little more Pain Location: Left Hip  Pain Descriptors / Indicators: Aching Pain Intervention(s): Limited activity within patient's tolerance;Monitored during session;Repositioned    Home Living                      Prior Function            PT Goals (current goals can now be found in the care plan section) Acute Rehab PT Goals Patient Stated Goal: patient wants to go home PT Goal Formulation: With patient/family Time For Goal Achievement: 10/03/18 Potential to Achieve Goals: Fair Progress towards PT  goals: Progressing toward goals    Frequency    Min 2X/week      PT Plan Discharge plan needs to be updated    Co-evaluation              AM-PAC PT "6 Clicks" Mobility   Outcome Measure  Help needed turning from your back to your side while in a flat bed without using bedrails?: A Little Help needed moving from lying on your back to sitting on the side of a flat bed without using bedrails?: A Little Help needed moving to and from a bed to a chair (including a wheelchair)?: A Little Help needed standing up from a chair using your arms (e.g., wheelchair or bedside chair)?: A Little Help needed to walk in hospital room?: A Lot Help needed  climbing 3-5 steps with a railing? : Total 6 Click Score: 15    End of Session Equipment Utilized During Treatment: Gait belt Activity Tolerance: Patient limited by fatigue;Patient limited by pain Patient left: in chair;with family/visitor present Nurse Communication: Mobility status PT Visit Diagnosis: Muscle weakness (generalized) (M62.81);Difficulty in walking, not elsewhere classified (R26.2);Unsteadiness on feet (R26.81);Pain Pain - Right/Left: Left Pain - part of body: Hip     Time: 0370-9643 PT Time Calculation (min) (ACUTE ONLY): 23 min  Charges:  $Gait Training: 8-22 mins $Therapeutic Exercise: 8-22 mins                     Amerigo Mcglory, PT, GCS 09/27/18,11:30 AM

## 2018-09-27 NOTE — [place] FL2 (Signed)
[place] MEDICAID FL2 LEVEL OF CARE SCREENING TOOL     IDENTIFICATION  Patient Name: Kristina CouncilmanDoris S Stahnke Birthdate: 08/25/1930 Sex: female Admission Date (Current Location): 09/24/2018  [place]   Facility and Address:  [facility], [address], [place], [place] [address]      Provider Number: [id number]  Attending Physician Name and Address:  Jama Flavorsjie, Jude, MD  Relative Name and Phone Number:  Ian Malkinewsome,Linda Daughter [phone] and Maximino SarinSprinkle,Ralph V Son [phone] and Dorena DewWatterson,Karen Daughter [phone]     Current Level of Care: Hospital Recommended Level of Care: Skilled Nursing Facility Prior Approval Number:    Date Approved/Denied:   PASRR Number: [id number]  Discharge Plan: SNF    Current Diagnoses: Patient Active Problem List   Diagnosis Date Noted  . Acute on chronic kidney failure (HCC) 09/25/2018  . Elevated troponin 09/25/2018  . Obesity 11/05/2015  . Routine general medical examination at a health care facility 05/07/2015  . Encounter for Medicare annual wellness exam 05/01/2014  . Pre-ulcerative calluses 11/01/2013  . Pedal edema 11/14/2009  . Renal insufficiency 09/24/2009  . VARICOSE VEINS, LOWER EXTREMITIES 12/05/2008  . Anemia 07/05/2007  . Diabetes type 2, controlled (HCC) 04/08/2007  . Diabetic nephropathy (HCC) 04/08/2007  . Diabetic neuropathy (HCC) 04/08/2007  . HYPERCHOLESTEROLEMIA 04/08/2007  . Macular degeneration (senile) of retina 04/08/2007  . Essential hypertension 04/08/2007  . URINARY INCONTINENCE 04/08/2007  . NEPHROLITHIASIS, HX OF 04/08/2007    Orientation RESPIRATION BLADDER Height & Weight     Self, Time, Situation, Place  Normal Continent Weight: 167 lb 1.7 oz (75.8 kg) Height:  5\' 2"  (157.5 cm)  BEHAVIORAL SYMPTOMS/MOOD NEUROLOGICAL BOWEL NUTRITION STATUS      Continent Diet(Heart Healthy)  AMBULATORY STATUS COMMUNICATION OF NEEDS Skin   Limited Assist Verbally  Normal                       Personal Care Assistance Level of Assistance  Bathing, Feeding, Dressing Bathing Assistance: Limited assistance Feeding assistance: Limited assistance Dressing Assistance: Limited assistance     Functional Limitations Info  Sight, Hearing, Speech Sight Info: Adequate Hearing Info: Adequate Speech Info: Adequate    SPECIAL CARE FACTORS FREQUENCY  PT (By licensed PT)     PT Frequency: Minimum 5x a week              Contractures Contractures Info: Not present    Additional Factors Info  Code Status, Allergies, Insulin Sliding Scale Code Status Info: Full Code Allergies Info: ETODOLAC, ACE INHIBITORS, ATORVASTATIN, GLIMEPIRIDE, GLIPIZIDE, METFORMIN, METOPROLOL TARTRATE, PROPOXYPHENE N-ACETAMINOPHEN, ROSUVASTATIN    Insulin Sliding Scale Info: insulin aspart (novoLOG) injection 0-5 Units 3x a day with meals       Current Medications (09/27/2018):  This is the current hospital active medication list Current Facility-Administered Medications  Medication Dose Route Frequency Provider Last Rate Last Dose  . acetaminophen (TYLENOL) tablet 650 mg  650 mg Oral Q6H PRN Oralia ManisWillis, David, MD       Or  . acetaminophen (TYLENOL) suppository 650 mg  650 mg Rectal Q6H PRN Oralia ManisWillis, David, MD      . ezetimibe (ZETIA) tablet 10 mg  10 mg Oral Daily Ojie, Jude, MD   10 mg at 09/27/18 0951  . heparin injection 5,000 Units  5,000 Units Subcutaneous Q8H Ojie, Jude, MD   5,000 Units at 09/27/18 1627  . insulin aspart (novoLOG) injection 0-5 Units  0-5 Units Subcutaneous QHS Oralia ManisWillis, David, MD      .  insulin aspart (novoLOG) injection 0-9 Units  0-9 Units Subcutaneous TID WC Oralia Manis, MD   1 Units at 09/26/18 1300  . losartan (COZAAR) tablet 50 mg  50 mg Oral Daily Ojie, Jude, MD   50 mg at 09/27/18 0951  . ondansetron (ZOFRAN) tablet 4 mg  4 mg Oral Q6H PRN Oralia Manis, MD       Or  . ondansetron [facility]) injection 4 mg  4 mg Intravenous Q6H PRN Oralia Manis, MD      . polyethylene glycol (MIRALAX / GLYCOLAX) packet 17 g  17 g Oral Daily Enid Baas Jude, MD         Discharge Medications: Please see discharge summary for a list of discharge medications.  Relevant Imaging Results:  Relevant Lab Results:   Additional Information SSN [id number]  Darleene Cleaver, LCSW

## 2018-09-28 LAB — GLUCOSE, CAPILLARY
GLUCOSE-CAPILLARY: 141 mg/dL — AB (ref 70–99)
Glucose-Capillary: 90 mg/dL (ref 70–99)
Glucose-Capillary: 144 mg/dL — ABNORMAL HIGH (ref 70–99)
Glucose-Capillary: 159 mg/dL — ABNORMAL HIGH (ref 70–99)
Glucose-Capillary: 143 mg/dL — ABNORMAL HIGH (ref 70–99)

## 2018-09-28 NOTE — Plan of Care (Signed)
  Problem: Elimination: Goal: Will not experience complications related to bowel motility Outcome: Progressing   Problem: Safety: Goal: Ability to remain free from injury will improve Outcome: Progressing   Problem: Clinical Measurements: Goal: Ability to maintain clinical measurements within normal limits will improve Outcome: Progressing

## 2018-09-28 NOTE — Progress Notes (Signed)
[facility] - [facility]   PATIENT NAME: Kristina Bennett    MR#:  [id number]  DATE OF BIRTH:  [birth year]  SUBJECTIVE:  CHIEF COMPLAINT:   Chief Complaint  Patient presents with  . Weakness  . Diarrhea    No new complaint this morning.  No chest pain.  No diarrhea.  No shortness of breath.  No fevers.  Resting comfortably.  Awaiting placement. REVIEW OF SYSTEMS:  Review of Systems  Constitutional: Negative for chills and fever.  HENT: Negative for hearing loss and tinnitus.   Eyes: Negative for blurred vision and double vision.  Respiratory: Negative for cough and shortness of breath.   Cardiovascular: Negative for chest pain and palpitations.  Gastrointestinal: Negative for abdominal pain, diarrhea, heartburn, nausea and vomiting.  Genitourinary: Negative for dysuria, frequency and urgency.  Musculoskeletal: Negative for myalgias and neck pain.  Skin: Negative for itching and rash.  Neurological: Negative for dizziness and headaches.  Psychiatric/Behavioral: Negative for depression and hallucinations.    DRUG ALLERGIES:   Allergies  Allergen Reactions  . Etodolac Shortness Of Breath and Other (See Comments)    REACTION: dizziness  . Ace Inhibitors Other (See Comments)    REACTION: unknown  . Atorvastatin Other (See Comments)    Reaction: muscle aches   . Glimepiride Other (See Comments)    Reaction: hypoglycemia  . Glipizide Other (See Comments)    REACTION: low sugar  . Metformin Other (See Comments)    REACTION: increased creatinine  . Metoprolol Tartrate Other (See Comments)    Reaction: unknown  . Propoxyphene N-Acetaminophen Other (See Comments)    Reaction: unknown  . Rosuvastatin Other (See Comments)    Reaction: muscle aches    VITALS:  Blood pressure (!) 122/48, pulse 88, temperature 98.6 F (37 C), temperature source Oral, resp. rate 18, height 5\' 2"  (1.575 m), weight 75.5 kg, SpO2 99 %. PHYSICAL EXAMINATION:     Physical Exam  Constitutional: She is oriented to person, place, and time. She appears well-developed and well-nourished.  HENT:  Head: Normocephalic and atraumatic.  Mouth/Throat: Oropharynx is clear and moist.  Eyes: Pupils are equal, round, and reactive to light. Conjunctivae and EOM are normal. Right eye exhibits no discharge.  Neck: Normal range of motion. Neck supple. No tracheal deviation present.  Cardiovascular: Normal rate, regular rhythm and normal heart sounds.  Respiratory: Effort normal and breath sounds normal.  GI: Soft. Bowel sounds are normal. She exhibits no distension. There is no abdominal tenderness.  Musculoskeletal: Normal range of motion.        General: No edema.  Neurological: She is alert and oriented to person, place, and time. No cranial nerve deficit.  Skin: Skin is warm. She is not diaphoretic. No erythema.  Psychiatric: She has a normal mood and affect. Her behavior is normal.   LABORATORY PANEL:  Female CBC Recent Labs  Lab 09/26/18 0531  WBC 6.2  HGB 11.5*  HCT 34.2*  PLT 107*   ------------------------------------------------------------------------------------------------------------------ Chemistries  Recent Labs  Lab 09/24/18 2140  09/26/18 0531  NA 134*   < > 138  K 4.7   < > 3.8  CL 107   < > 110  CO2 16*   < > 22  GLUCOSE 194*   < > 110*  BUN 52*   < > 46*  CREATININE 1.62*   < > 1.19*  CALCIUM 9.5   < > 8.8*  MG  --   --  1.7  AST 52*  --   --   ALT 21  --   --   ALKPHOS 79  --   --   BILITOT 1.5*  --   --    < > = values in this interval not displayed.   RADIOLOGY:  No results found. ASSESSMENT AND PLAN:   1.  Elevated troponin -secondary to demand ischemia Patient denies any chest pains.  No shortness of breath.  Evaluated by cardiologist.  Appreciate input. Does not appear to be acute coronary syndrome given presenting scenario.  Troponin level remains fairly flat with most recent troponin of 0.11.   Patient was  started on heparin drip previously.  Cardiologist discontinued heparin drip previously .2D echocardiogram done revealed ejection fraction of 60 to 65%.  Cavity size is normal.  Evidence of impaired relaxation noted.  No further work-up recommended by cardiologist  2. Acute on chronic kidney failure  Resolved with IV fluid hydration  3. Left hip pain - X-ray of the left hip done revealed degenerative change without acute abnormalities.  Symptomatic management.  This morning patient ambulated about 15 feet with physical therapy using rolling walker. I discussed with patient's daughter Kristina Bennett who is also healthcare power of attorney.  She stated the primary reason for bringing patient to the hospital is generalized weakness and patient having difficulty with ambulation.  Patient will benefit from short-term rehabilitation prior to going back home with family to help with getting patient back to prior level of functioning.  Patient was able to ambulate independently prior to admission.CT scan of the head done with no acute findings. Patient evaluated by physical therapist.  Recommendation is for skilled nursing facility placement.  Case manager assisting with placement  4.  Diabetes type 2, controlled  -sliding scale insulin coverage  5.  Essential hypertension -blood pressure controlled.  Continue home meds  6. HYPERCHOLESTEROLEMIA -home dose antilipid  DVT prophylaxis; heparin  All the records are reviewed and case discussed with Care Management/Social Worker. Management plans discussed with the patient, family and they are in agreement.  CODE STATUS: Full Code  TOTAL TIME TAKING CARE OF THIS PATIENT: .   More than 50% of the time was spent in counseling/coordination of care: YES  POSSIBLE D/C IN 1 DAY, DEPENDING ON CLINICAL CONDITION.   Kristina Bennett M.D on 09/28/2018 at 2:11 PM  Between 7am to 6pm - Pager - [phone]  After 6pm go to [email or web address] - Film/video editor [facility]  Office  [phone]  CC: Primary care physician; Tower, Audrie Gallus, MD  Note: This dictation was prepared with Dragon dictation along with smaller phrase technology. Any transcriptional errors that result from this process are unintentional.

## 2018-09-28 NOTE — Clinical Social Work Note (Addendum)
CSW presented bed offers to patient and her family they are going to discuss and let CSW know.  CSW asked family to let me know as soon as possible.  CSW to continue to follow.  3:15pm CSW received phone call from patient's family they have chosen [facility] and [address].  CSW contacted SNF and they will start insurance authorization.  Kristina Bennett. Kristina Bennett, MSW, LCSW [phone]  09/28/2018 2:53 PM

## 2018-09-28 NOTE — TOC Initial Note (Signed)
Transition of Care [facility]) - Initial/Assessment Note    Patient Details  Name: Kristina Bennett MRN: [id number] Date of Birth: October 01, 1930  [facility]) CM/SW Contact:    Darleene Cleaver, LCSW Phone Number: 09/28/2018, 6:22 PM  Clinical Narrative:                   Expected Discharge Plan: Skilled Nursing Facility Barriers to Discharge: Insurance Authorization, Continued Medical Work up   Patient Goals and CMS Choice Patient states their goals for this hospitalization and ongoing recovery are:: Discharge to daughter's house with home health CMS Medicare.gov Compare Post Acute Care list provided to:: Patient Choice offered to / list presented to : Patient, Adult Children  Expected Discharge Plan and Services Expected Discharge Plan: Skilled Nursing Facility   Post Acute Care Choice: Nursing Home Living arrangements for the past 2 months: Single Family Home                          Prior Living Arrangements/Services Living arrangements for the past 2 months: Single Family Home Lives with:: Self Patient language and need for interpreter reviewed:: No Do you feel safe going back to the place where you live?: No   Patient and family feel she needs short term rehab before she is able to return back home.  Need for Family Participation in Patient Care: Yes (Comment) Care giver support system in place?: No (comment)   Criminal Activity/Legal Involvement Pertinent to Current Situation/Hospitalization: No - Comment as needed  Activities of Daily Living Home Assistive Devices/Equipment: Walker (specify type) ADL Screening (condition at time of admission) Patient's cognitive ability adequate to safely complete daily activities?: Yes Is the patient deaf or have difficulty hearing?: No Does the patient have difficulty seeing, even when wearing glasses/contacts?: No Does the patient have difficulty concentrating, remembering, or making decisions?: No Patient able to  express need for assistance with ADLs?: Yes Does the patient have difficulty dressing or bathing?: Yes Independently performs ADLs?: No Communication: Independent Dressing (OT): Needs assistance Is this a change from baseline?: Pre-admission baseline Grooming: Independent Feeding: Independent Bathing: Needs assistance Is this a change from baseline?: Pre-admission baseline Toileting: Needs assistance Is this a change from baseline?: Pre-admission baseline In/Out Bed: Needs assistance Is this a change from baseline?: Pre-admission baseline Walks in Home: Needs assistance Is this a change from baseline?: Pre-admission baseline Does the patient have difficulty walking or climbing stairs?: Yes Weakness of Legs: Both Weakness of Arms/Hands: Both  Permission Sought/Granted Permission sought to share information with : Facility Medical sales representative, Family Supports Permission granted to share information with : Yes, Verbal Permission Granted  Share Information with NAME: Ian Malkin Daughter [phone]  [phone] or Elizabet, Macia [phone] or Chipper Oman [phone]   Permission granted to share info w AGENCY: SNF admissions        Emotional Assessment Appearance:: Appears stated age   Affect (typically observed): Pleasant, Stable Orientation: : Oriented to Self, Oriented to Place Alcohol / Substance Use: Not Applicable Psych Involvement: No (comment)  Admission diagnosis:  Dehydration [E86.0] Hip pain [M25.559] Non-STEMI (non-ST elevated myocardial infarction) (HCC) [I21.4] Generalized weakness [R53.1] Patient Active Problem List   Diagnosis Date Noted  . Acute on chronic kidney failure (HCC) 09/25/2018  . Elevated troponin 09/25/2018  . Obesity 11/05/2015  . Routine general medical examination at a health care facility 05/07/2015  . Encounter for Medicare annual wellness exam 05/01/2014  . Pre-ulcerative calluses 11/01/2013  .  Pedal edema  11/14/2009  . Renal insufficiency 09/24/2009  . VARICOSE VEINS, LOWER EXTREMITIES 12/05/2008  . Anemia 07/05/2007  . Diabetes type 2, controlled (HCC) 04/08/2007  . Diabetic nephropathy (HCC) 04/08/2007  . Diabetic neuropathy (HCC) 04/08/2007  . HYPERCHOLESTEROLEMIA 04/08/2007  . Macular degeneration (senile) of retina 04/08/2007  . Essential hypertension 04/08/2007  . URINARY INCONTINENCE 04/08/2007  . NEPHROLITHIASIS, HX OF 04/08/2007   PCP:  Judy Pimple, MD Pharmacy:   [facility] - [place], [place] - [address] [place] [address] Phone: [phone] Fax: [phone]  [facility] - [place], [place] - [address] [place] [address] Phone: [phone] Fax: [phone]  [facility] - [place], [place] - [id number], [address] [place] [address] Phone: [phone] Fax: [phone]     Social Determinants of Health (SDOH) Interventions    Readmission Risk Interventions  No flowsheet data found.

## 2018-09-29 LAB — CBC
HEMATOCRIT: 30.2 % — AB (ref 36.0–46.0)
Hemoglobin: 9.7 g/dL — ABNORMAL LOW (ref 12.0–15.0)
MCH: 31.2 pg (ref 26.0–34.0)
MCHC: 32.1 g/dL (ref 30.0–36.0)
MCV: 97.1 fL (ref 80.0–100.0)
Platelets: 109 10*3/uL — ABNORMAL LOW (ref 150–400)
RBC: 3.11 MIL/uL — ABNORMAL LOW (ref 3.87–5.11)
RDW: 13.4 % (ref 11.5–15.5)
WBC: 5 10*3/uL (ref 4.0–10.5)
nRBC: 0 % (ref 0.0–0.2)

## 2018-09-29 LAB — GLUCOSE, CAPILLARY
Glucose-Capillary: 100 mg/dL — ABNORMAL HIGH (ref 70–99)
Glucose-Capillary: 107 mg/dL — ABNORMAL HIGH (ref 70–99)

## 2018-09-29 LAB — CREATININE, SERUM
Creatinine, Ser: 1.17 mg/dL — ABNORMAL HIGH (ref 0.44–1.00)
GFR calc Af Amer: 49 mL/min — ABNORMAL LOW (ref >60)
GFR calc non Af Amer: 42 mL/min — ABNORMAL LOW (ref >60)

## 2018-09-29 MED ORDER — LOSARTAN POTASSIUM 50 MG PO TABS: 100.0000 mg | ORAL_TABLET | Freq: Every day | ORAL | 0 refills | Status: DC

## 2018-09-29 NOTE — Discharge Summary (Signed)
[place] [facility] [place] NAME: Kristina Bennett    MR#:  [id number]  DATE OF BIRTH:  [birth year]  DATE OF ADMISSION:  09/24/2018   ADMITTING PHYSICIAN: Lance Coon, MD  DATE OF DISCHARGE: 09/29/2018  PRIMARY CARE PHYSICIAN: Tower, Wynelle Fanny, MD   ADMISSION DIAGNOSIS:  Dehydration [E86.0] Hip pain [M25.559] Non-STEMI (non-ST elevated myocardial infarction) (HCC) [I21.4] Generalized weakness [R53.1] DISCHARGE DIAGNOSIS:  Principal Problem:   Elevated troponin Active Problems:   Diabetes type 2, controlled ([place])   HYPERCHOLESTEROLEMIA   Essential hypertension   Acute on chronic kidney failure ([place])  SECONDARY DIAGNOSIS:   Past Medical History:  Diagnosis Date  . Anemia   . Anemia   . Diabetes mellitus   . DM2 (diabetes mellitus, type 2) ([place])   . HTN (hypertension)   . Kidney congenitally absent, left    small  . Macular degeneration   . Macular degeneration   . Nephrolithiasis    hx  . Renal insufficiency    poss renal vasc dz  . Renal insufficiency   . Rheumatoid arthritis(714.0)   . Rheumatoid arthritis(714.0)   . Umbilical hernia   . Urinary incontinence    HOSPITAL COURSE:  Chief complaint; diarrhea and weakness   History of presenting complaint; Kristina Bennett  is a 85 y.o. female who presents with chief complaint as above.  Patient presented to the ED with a complaint of several days of fatigue and generalized weakness.  She is a poor historian due to mild dementia and HPI is given mostly by her daughter.  Daughter stated the patient did not complain at any point of any chest pain or shortness of breath.  However, on evaluation here in the ED she is noted to have an elevated troponin, and some acute on chronic renal failure.  Hospitalist were called for admission.  Please refer to the H&P dictated for further details   Hospital course; 1. Elevated troponin -secondary to demand ischemia Patient denies any chest  pains.  No shortness of breath.  Evaluated by cardiologist.  Appreciate input. Does not appear to be acute coronary syndrome given presenting scenario.  Troponin level remains fairly flat with most recent troponin of 0.11.  Patient was started on heparin drip previously.  Cardiologist discontinued heparin drip previously .2D echocardiogram done revealed ejection fraction of 60 to 65%.  Cavity size is normal.  Evidence of impaired relaxation noted.  No further work-up recommended by cardiologist  2.Acute on chronic kidney failure  Resolved with IV fluid hydration  3. Left hip pain - X-ray of the left hip done revealed degenerative change without acute abnormalities.  Symptomatic management.  Patient evaluated by physical therapist during this admission.  Recommendation was for skilled nursing facility placement on discharge. Patient will benefit from short-term rehabilitation prior to going back home with family to help with getting patient back to prior level of functioning.  Patient was able to ambulate independently prior to admission.CT scan of the head done with no acute findings.  4.Diabetes type 2, controlled   Resumed home regimen  5.Essential hypertension -blood pressure controlled.  Continue home meds with losartan.  Holding off on resuming hydrochlorothiazide due to recent acute kidney injury.  6.HYPERCHOLESTEROLEMIA -home dose antilipid  DISCHARGE CONDITIONS:  Stable.  Being discharged to skilled nursing facility for short-term rehab CONSULTS OBTAINED:  Treatment Team:  Teodoro Spray, MD DRUG ALLERGIES:   Allergies  Allergen Reactions  . Etodolac Shortness Of Breath and Other (  See Comments)    REACTION: dizziness  . Ace Inhibitors Other (See Comments)    REACTION: unknown  . Atorvastatin Other (See Comments)    Reaction: muscle aches   . Glimepiride Other (See Comments)    Reaction: hypoglycemia  . Glipizide Other (See Comments)    REACTION: low sugar  .  Metformin Other (See Comments)    REACTION: increased creatinine  . Metoprolol Tartrate Other (See Comments)    Reaction: unknown  . Propoxyphene N-Acetaminophen Other (See Comments)    Reaction: unknown  . Rosuvastatin Other (See Comments)    Reaction: muscle aches    DISCHARGE MEDICATIONS:   Allergies as of 09/29/2018      Reactions   Etodolac Shortness Of Breath, Other (See Comments)   REACTION: dizziness   Ace Inhibitors Other (See Comments)   REACTION: unknown   Atorvastatin Other (See Comments)   Reaction: muscle aches    Glimepiride Other (See Comments)   Reaction: hypoglycemia   Glipizide Other (See Comments)   REACTION: low sugar   Metformin Other (See Comments)   REACTION: increased creatinine   Metoprolol Tartrate Other (See Comments)   Reaction: unknown   Propoxyphene N-acetaminophen Other (See Comments)   Reaction: unknown   Rosuvastatin Other (See Comments)   Reaction: muscle aches       Medication List    STOP taking these medications   gabapentin 300 MG capsule Commonly known as:  NEURONTIN   losartan-hydrochlorothiazide 100-12.5 MG tablet Commonly known as:  HYZAAR   mupirocin ointment 2 % Commonly known as:  Bactroban   silver sulfADIAZINE 1 % cream Commonly known as:  Silvadene     TAKE these medications   Bayer Contour Test test strip Generic drug:  glucose blood 1 each by Other route daily. Use as instructed   Contour Blood Glucose System w/Device Kit Use to check blood sugar once daily   ezetimibe 10 MG tablet Commonly known as:  ZETIA TAKE 1 TABLET DAILY What changed:  when to take this   losartan 50 MG tablet Commonly known as:  COZAAR Take 2 tablets (100 mg total) by mouth daily. Start taking on:  September 30, 2018   tolterodine 4 MG 24 hr capsule Commonly known as:  DETROL LA TAKE 1 CAPSULE DAILY   Vitamin D3 50 MCG (2000 UT) capsule Take 2,000 Units by mouth daily.        DISCHARGE INSTRUCTIONS:   DIET:  Diabetic  diet DISCHARGE CONDITION:  Stable ACTIVITY:  Activity as tolerated and out of bed with assistance OXYGEN:  Home Oxygen: No.  Oxygen Delivery: room air DISCHARGE LOCATION:  home   If you experience worsening of your admission symptoms, develop shortness of breath, life threatening emergency, suicidal or homicidal thoughts you must seek medical attention immediately by calling 911 or calling your MD immediately  if symptoms less severe.  You Must read complete instructions/literature along with all the possible adverse reactions/side effects for all the Medicines you take and that have been prescribed to you. Take any new Medicines after you have completely understood and accpet all the possible adverse reactions/side effects.   Please note  You were cared for by a hospitalist during your hospital stay. If you have any questions about your discharge medications or the care you received while you were in the hospital after you are discharged, you can call the unit and asked to speak with the hospitalist on call if the hospitalist that took care of you is not  available. Once you are discharged, your primary care physician will handle any further medical issues. Please note that NO REFILLS for any discharge medications will be authorized once you are discharged, as it is imperative that you return to your primary care physician (or establish a relationship with a primary care physician if you do not have one) for your aftercare needs so that they can reassess your need for medications and monitor your lab values.    On the day of Discharge:  VITAL SIGNS:  Blood pressure (!) 127/46, pulse 82, temperature 98.4 F (36.9 C), temperature source Oral, resp. rate 18, height _0  (1.575 m), weight 75.5 kg, SpO2 98 %. PHYSICAL EXAMINATION:  GENERAL:  85 y.o.-year-old patient lying in the bed with no acute distress.  EYES: Pupils equal, round, reactive to light and accommodation. No scleral icterus.  Extraocular muscles intact.  HEENT: Head atraumatic, normocephalic. Oropharynx and nasopharynx clear.  NECK:  Supple, no jugular venous distention. No thyroid enlargement, no tenderness.  LUNGS: Normal breath sounds bilaterally, no wheezing, rales,rhonchi or crepitation. No use of accessory muscles of respiration.  CARDIOVASCULAR: S1, S2 normal. No murmurs, rubs, or gallops.  ABDOMEN: Soft, non-tender, non-distended. Bowel sounds present. No organomegaly or mass.  EXTREMITIES: No pedal edema, cyanosis, or clubbing.  NEUROLOGIC: Cranial nerves II through XII are intact. Muscle strength 5/5 in all extremities. Sensation intact. Gait not checked.  PSYCHIATRIC: The patient is alert and oriented x 3.  SKIN: No obvious rash, lesion, or ulcer.  DATA REVIEW:   CBC Recent Labs  Lab 09/29/18 0612  WBC 5.0  HGB 9.7*  HCT 30.2*  PLT 109*    Chemistries  Recent Labs  Lab 09/24/18 2140  09/26/18 0531 09/29/18 0612  NA 134*   < > 138  --   K 4.7   < > 3.8  --   CL 107   < > 110  --   CO2 16*   < > 22  --   GLUCOSE 194*   < > 110*  --   BUN 52*   < > 46*  --   CREATININE 1.62*   < > 1.19* 1.17*  CALCIUM 9.5   < > 8.8*  --   MG  --   --  1.7  --   AST 52*  --   --   --   ALT 21  --   --   --   ALKPHOS 79  --   --   --   BILITOT 1.5*  --   --   --    < > = values in this interval not displayed.     Microbiology Results  Results for orders placed or performed during the hospital encounter of 09/24/18  Urine culture     Status: None   Collection Time: 09/24/18 11:29 PM  Result Value Ref Range Status   Specimen Description   Final    URINE, CATHETERIZED Performed at [facility], [address], [place], [place] [address]    Special Requests   Final    NONE Performed at [facility], [address] [place], [place] [address]    Culture   Final    NO GROWTH Performed at [facility], [place] [address] [place], [place] [address]    Report Status  09/26/2018 FINAL  Final    RADIOLOGY:  No results found.   Management plans discussed with the patient, family and they are in agreement.  CODE STATUS: Full Code   TOTAL TIME TAKING CARE OF THIS PATIENT: 35 minutes.    Goebel Hellums M.D on 09/29/2018 at 11:45 AM  Between 7am to 6pm - Pager - [phone]  After 6pm go to [email or web address] - [employer] [facility]  Office  [phone]  CC: Primary care physician; Tower, Wynelle Fanny, MD   Note: This dictation was prepared with Dragon dictation along with smaller phrase technology. Any transcriptional errors that result from this process are unintentional.

## 2018-09-29 NOTE — Clinical Social Work Note (Addendum)
CSW contacted [facility] to see if they have received insurance auth yet, they said it is still pending.  CSW also sent updated therapy notes.  CSW to continue to follow patient's progress throughout discharge planning.  2:41pm  CSW received phone call from SNF that they have received insurance authorization and can accept patient today if paperwork is completed.  CSW attempted to call patient's daughter back and had to leave a message.  Awaiting for call back from patient's daughter.  Ervin Knack. Emanuela Runnion, MSW, LCSW [phone]  09/29/2018 1:48 PM

## 2018-09-29 NOTE — Progress Notes (Signed)
Physical Therapy Treatment Patient Details Name: Kristina Bennett MRN: [id number] DOB: 04/15/1931 Today's Date: 09/29/2018    History of Present Illness Patient is an 85 year old female admitted from home with reports of weakness and diarrhea.  PMH to include: anemia, DM, HTN, macular degeneration, RA.       PT Comments    In recliner.  Pt and family reported some increased difficulty with transfer this am.  Pt stood with min a to walker from recliner.  Hesitance stepping in place due to L hip discomfort.  She was able to stand x 2 but did not feel comfortable stepping but remained up for several minutes each attempt.  Participated in exercises as described below.   Follow Up Recommendations  SNF     Equipment Recommendations  None recommended by PT    Recommendations for Other Services       Precautions / Restrictions Precautions Precautions: Fall Restrictions Weight Bearing Restrictions: No    Mobility  Bed Mobility               General bed mobility comments: Not assessed this day, patient in recliner   Transfers Overall transfer level: Needs assistance Equipment used: Rolling walker (2 wheeled) Transfers: Sit to/from Stand Sit to Stand: Min assist         General transfer comment: increased time needed  Ambulation/Gait         Gait velocity: decreased   General Gait Details: gait deferred as she had hip discomfort and hesitancy with stepping in place.   Stairs             Wheelchair Mobility    Modified Rankin (Stroke Patients Only)       Balance Overall balance assessment: Needs assistance Sitting-balance support: Feet supported Sitting balance-Leahy Scale: Good     Standing balance support: Bilateral upper extremity supported Standing balance-Leahy Scale: Fair                              Cognition Arousal/Alertness: Awake/alert Behavior During Therapy: WFL for tasks assessed/performed Overall Cognitive Status:  History of cognitive impairments - at baseline                                        Exercises Other Exercises Other Exercises: standing x 2 for marching in place BLE, seated LAQ, marches, ab/add and ankle pumps 2 x 10    General Comments        Pertinent Vitals/Pain Pain Assessment: Faces Faces Pain Scale: Hurts little more Pain Location: L hip, R neck soreness Pain Descriptors / Indicators: Sore Pain Intervention(s): Limited activity within patient's tolerance;Monitored during session    Home Living                      Prior Function            PT Goals (current goals can now be found in the care plan section) Progress towards PT goals: Progressing toward goals    Frequency    Min 2X/week      PT Plan Current plan remains appropriate    Co-evaluation              AM-PAC PT "6 Clicks" Mobility   Outcome Measure  Help needed turning from your back to your side while in a flat  bed without using bedrails?: A Little Help needed moving from lying on your back to sitting on the side of a flat bed without using bedrails?: A Little Help needed moving to and from a bed to a chair (including a wheelchair)?: A Little Help needed standing up from a chair using your arms (e.g., wheelchair or bedside chair)?: A Little Help needed to walk in hospital room?: A Lot Help needed climbing 3-5 steps with a railing? : Total 6 Click Score: 15    End of Session Equipment Utilized During Treatment: Gait belt Activity Tolerance: Patient limited by fatigue;Patient limited by pain Patient left: in chair;with call bell/phone within reach;with chair alarm set;with family/visitor present   Pain - Right/Left: Left Pain - part of body: Hip     Time: 9323-5573 PT Time Calculation (min) (ACUTE ONLY): 15 min  Charges:  $Therapeutic Exercise: 8-22 mins                     Danielle Dess, PTA 09/29/18, 11:47 AM

## 2018-10-25 ENCOUNTER — Telehealth: Payer: Self-pay | Admitting: Family Medicine

## 2018-10-25 NOTE — Telephone Encounter (Signed)
Best number [phone]  kristan @ [facility]  Called to schedule pt am appointment she is being discharged tomorrow.  Is it ok to do in office appointment??

## 2018-10-25 NOTE — Telephone Encounter (Signed)
Please schedule for virtual visit if possible

## 2018-10-25 NOTE — Telephone Encounter (Signed)
I left a message on Kristina Bennett's voice mail to call back and schedule a virtual office visit for patient.

## 2018-10-26 ENCOUNTER — Telehealth: Payer: Self-pay | Admitting: Family Medicine

## 2018-10-26 ENCOUNTER — Ambulatory Visit: Payer: Medicare Other

## 2018-10-26 NOTE — Telephone Encounter (Signed)
Want to know if [facility] can call us with the plan of care for the patient. Please advise

## 2018-10-26 NOTE — Telephone Encounter (Signed)
Yes that is fine

## 2018-10-27 NOTE — Telephone Encounter (Signed)
Tara notified.

## 2018-10-27 NOTE — Telephone Encounter (Signed)
Aware, thanks!

## 2018-10-27 NOTE — Telephone Encounter (Signed)
FYI: there will be a delay for OT due to staff availability.

## 2018-10-31 ENCOUNTER — Ambulatory Visit (INDEPENDENT_AMBULATORY_CARE_PROVIDER_SITE_OTHER): Payer: Medicare Other | Admitting: Family Medicine

## 2018-10-31 ENCOUNTER — Encounter: Payer: Self-pay | Admitting: Family Medicine

## 2018-10-31 VITALS — BP 150/78 | Temp 97.6°F | Wt 171.0 lb

## 2018-10-31 DIAGNOSIS — M1612 Unilateral primary osteoarthritis, left hip: Secondary | ICD-10-CM

## 2018-10-31 DIAGNOSIS — Z6832 Body mass index (BMI) 32.0-32.9, adult: Secondary | ICD-10-CM

## 2018-10-31 DIAGNOSIS — E1122 Type 2 diabetes mellitus with diabetic chronic kidney disease: Secondary | ICD-10-CM

## 2018-10-31 DIAGNOSIS — R778 Other specified abnormalities of plasma proteins: Secondary | ICD-10-CM

## 2018-10-31 DIAGNOSIS — R7989 Other specified abnormal findings of blood chemistry: Secondary | ICD-10-CM

## 2018-10-31 DIAGNOSIS — D649 Anemia, unspecified: Secondary | ICD-10-CM

## 2018-10-31 DIAGNOSIS — E7800 Pure hypercholesterolemia, unspecified: Secondary | ICD-10-CM

## 2018-10-31 DIAGNOSIS — N179 Acute kidney failure, unspecified: Secondary | ICD-10-CM | POA: Diagnosis not present

## 2018-10-31 DIAGNOSIS — I10 Essential (primary) hypertension: Secondary | ICD-10-CM | POA: Diagnosis not present

## 2018-10-31 DIAGNOSIS — K5900 Constipation, unspecified: Secondary | ICD-10-CM

## 2018-10-31 DIAGNOSIS — E6609 Other obesity due to excess calories: Secondary | ICD-10-CM

## 2018-10-31 DIAGNOSIS — R413 Other amnesia: Secondary | ICD-10-CM | POA: Insufficient documentation

## 2018-10-31 DIAGNOSIS — N189 Chronic kidney disease, unspecified: Secondary | ICD-10-CM

## 2018-10-31 MED ORDER — LOSARTAN POTASSIUM 100 MG PO TABS: 50.0000 mg | ORAL_TABLET | Freq: Every day | ORAL | 0 refills | Status: DC

## 2018-10-31 NOTE — Assessment & Plan Note (Signed)
Hb down to 9.7  Likely anemia of chronic dz Feeling better Reviewed hospital records, lab results and studies in detail   Will re check at 3 mo f/u (or earlier if needed if not feeling better)

## 2018-10-31 NOTE — Assessment & Plan Note (Signed)
Disc goals for lipids and reasons to control them Rev last labs with pt Rev low sat fat diet in detail Will continue zetia  Intol of statins

## 2018-10-31 NOTE — Assessment & Plan Note (Signed)
Stool softener not working  Will try miralax-titrate to effect Also increase fluids most importantly  Will update

## 2018-10-31 NOTE — Assessment & Plan Note (Signed)
Lab Results  Component Value Date   HGBA1C 5.9 06/22/2017   Glucose control stable at home  Diet controlled

## 2018-10-31 NOTE — Assessment & Plan Note (Signed)
Noted in hospital Age related Family helps out at home  No accidents

## 2018-10-31 NOTE — Assessment & Plan Note (Signed)
Lost down to 160s in hospital-now up to 171  Fairly stable Enc her to continue PT for exercise and eat well

## 2018-10-31 NOTE — Progress Notes (Signed)
Virtual Visit via Video Note  I connected with Kristina Bennett on 10/31/18 at 10:15 AM EDT by a video enabled telemedicine application and verified that I am speaking with the correct person using two identifiers. The patient is at home I am in my office    I discussed the limitations of evaluation and management by telemedicine and the availability of in person appointments. The patient expressed understanding and agreed to proceed.  History of Present Illness: Pt was hospitalized at [facility] from 3/7 to 09/29/18 for dehydration /generalized weakness (acute on chronic renal fail)  as well as non-STEMI and hip pain   Home from rehab since April 8th   She presented with malaise /fatigue (no CP) and troponin was found to be elevated  Hospital course as follows :  Hospital course 1. Elevated troponin -secondary to demand ischemia Patient denies any chest pains. No shortness of breath. Evaluated by cardiologist. Appreciate input. Does not appear to be acute coronary syndrome given presenting scenario. Troponin level remains fairly flat with most recent troponin of 0.11. Patient was started on heparin drip previously. Cardiologist discontinued heparin drip previously .2D echocardiogram done revealed ejection fraction of 60 to 65%. Cavity size is normal. Evidence of impaired relaxation noted. No further work-up recommended by cardiologist  Lab Results  Component Value Date   TROPONINI 0.11 ([place]) 09/25/2018     2.Acute on chronic kidney failure  Resolved with IV fluid hydration  She is drinking more fluids now  Daughter thinks still not enough  Changed to water with meals with ice  Likes diet coke   Weight was 171 at home (160s in the hospital) Appetite is ok   Improved at d/c Lab Results  Component Value Date   CREATININE 1.17 (H) 09/29/2018   BUN 46 (H) 09/26/2018   NA 138 09/26/2018   K 3.8 09/26/2018   CL 110 09/26/2018   CO2 22 09/26/2018   Cr was down from  1.62  3. Left hip pain - X-ray of the left hip done revealed degenerative change without acute abnormalities. Symptomatic management.  Patient evaluated by physical therapist during this admission.  Recommendation was for skilled nursing facility placement on discharge.Patient will benefit from short-term rehabilitation prior to going back home with family to help with getting patient back to prior level of functioning. Patient was able to ambulate independently prior to admission.CT scan of the head done with no acute findings.  She could not stand at admit  She did PT in the nsg home and at home- will do this week and OT Now can walk with walker  Gets up and down with a lot of effort  Now knee R hurts more   Short term memory is getting worse    4.Diabetes type 2, controlled  Resumed home regimen  Lab Results  Component Value Date   HGBA1C 5.9 06/22/2017  she cannot take ace   5.Essential hypertension -blood pressure controlled. Continue home meds with losartan.  Holding off on resuming hydrochlorothiazide due to recent acute kidney injury.  BP Readings from Last 3 Encounters:  09/29/18 (!) 127/46  06/22/17 130/66  03/10/17 128/68   Per family- at SNF they were dosing her losartan as needed  This am 150/78  Having some constipation-giving her stool softeners  6.HYPERCHOLESTEROLEMIA -home dose antilipid zetia was stopped Lab Results  Component Value Date   CHOL 195 06/22/2017   HDL 52.60 06/22/2017   LDLCALC 125 (H) 06/22/2017   LDLDIRECT 144.0 10/31/2014  TRIG 87.0 06/22/2017   CHOLHDL 4 06/22/2017    [facility] Lab Results  Component Value Date   WBC 5.0 09/29/2018   HGB 9.7 (L) 09/29/2018   HCT 30.2 (L) 09/29/2018   MCV 97.1 09/29/2018   PLT 109 (L) 09/29/2018     temp is 97.6 No cough or problems Staying in   Review of Systems  Constitutional: Negative for chills, fever, malaise/fatigue and weight loss.  HENT: Negative for congestion.    Eyes: Positive for blurred vision. Negative for discharge.  Respiratory: Negative for cough, shortness of breath and wheezing.   Cardiovascular: Negative for chest pain, palpitations, leg swelling and PND.  Gastrointestinal: Negative for nausea and vomiting.  Genitourinary: Negative for dysuria.  Musculoskeletal: Positive for joint pain and myalgias.  Skin: Negative for rash.  Neurological: Negative for dizziness and headaches.  Psychiatric/Behavioral: Negative for depression.       Mood is ok     Patient Active Problem List   Diagnosis Date Noted  . Constipation 10/31/2018  . Acute on chronic kidney failure ([place]) 09/25/2018  . Elevated troponin 09/25/2018  . Obesity 11/05/2015  . Routine general medical examination at a health care facility 05/07/2015  . Encounter for Medicare annual wellness exam 05/01/2014  . Pre-ulcerative calluses 11/01/2013  . Pedal edema 11/14/2009  . Renal insufficiency 09/24/2009  . VARICOSE VEINS, LOWER EXTREMITIES 12/05/2008  . Anemia 07/05/2007  . Diabetes type 2, controlled ([place]) 04/08/2007  . Diabetic nephropathy ([place]) 04/08/2007  . Diabetic neuropathy ([place]) 04/08/2007  . HYPERCHOLESTEROLEMIA 04/08/2007  . Macular degeneration (senile) of retina 04/08/2007  . Essential hypertension 04/08/2007  . URINARY INCONTINENCE 04/08/2007  . NEPHROLITHIASIS, HX OF 04/08/2007   Past Medical History:  Diagnosis Date  . Anemia   . Anemia   . Diabetes mellitus   . DM2 (diabetes mellitus, type 2) ([place])   . HTN (hypertension)   . Kidney congenitally absent, left    small  . Macular degeneration   . Macular degeneration   . Nephrolithiasis    hx  . Renal insufficiency    poss renal vasc dz  . Renal insufficiency   . Rheumatoid arthritis(714.0)   . Rheumatoid arthritis(714.0)   . Umbilical hernia   . Urinary incontinence    Past Surgical History:  Procedure Laterality Date  . 411 echo     mild diastolic dysfunction EF 48%, slt pulm HTN   .  cataract signs     Social History   Tobacco Use  . Smoking status: Never Smoker  . Smokeless tobacco: Never Used  Substance Use Topics  . Alcohol use: No    Alcohol/week: 0.0 standard drinks  . Drug use: No   Family History  Problem Relation Age of Onset  . Heart attack Mother   . Prostate cancer Father    Allergies  Allergen Reactions  . Etodolac Shortness Of Breath and Other (See Comments)    REACTION: dizziness  . Ace Inhibitors Other (See Comments)    REACTION: unknown  . Atorvastatin Other (See Comments)    Reaction: muscle aches   . Glimepiride Other (See Comments)    Reaction: hypoglycemia  . Glipizide Other (See Comments)    REACTION: low sugar  . Metformin Other (See Comments)    REACTION: increased creatinine  . Metoprolol Tartrate Other (See Comments)    Reaction: unknown  . Propoxyphene N-Acetaminophen Other (See Comments)    Reaction: unknown  . Rosuvastatin Other (See Comments)    Reaction: muscle aches  Current Outpatient Medications on File Prior to Visit  Medication Sig Dispense Refill  . Blood Glucose Monitoring Suppl (CONTOUR BLOOD GLUCOSE SYSTEM) W/DEVICE KIT Use to check blood sugar once daily     . Cholecalciferol (VITAMIN D3) 50 MCG (2000 UT) capsule Take 2,000 Units by mouth daily.    Mariane Baumgarten Calcium (STOOL SOFTENER PO) Take 1 tablet by mouth daily as needed.    Marland Kitchen glucose blood (BAYER CONTOUR TEST) test strip 1 each by Other route daily. Use as instructed     . tolterodine (DETROL LA) 4 MG 24 hr capsule TAKE 1 CAPSULE DAILY (Patient taking differently: Take 4 mg by mouth daily. ) 90 capsule 1  . ezetimibe (ZETIA) 10 MG tablet TAKE 1 TABLET DAILY (Patient taking differently: Take 10 mg by mouth at bedtime. ) 90 tablet 1   No current facility-administered medications on file prior to visit.     Observations/Objective: Pt appears well on the screen Dementia noted-but she does answer some questions Daughter gives most of the hx  No skin  change or rash  No facial swelling  No obvious weight change  Smiling and content  Did not obs gait  Not hoarse  No cough    Assessment and Plan: Problem List Items Addressed This Visit      Cardiovascular and Mediastinum   Essential hypertension    Giving 1/2 of the 100 losartan for bp over 140/90  This am given for 150/78 Staying off hctz  Will replace losartan 100 with the 50 mg pill if needed for next refill  Continue to monitor F/u 3 mo      Relevant Medications   losartan (COZAAR) 100 MG tablet     Endocrine   Diabetes type 2, controlled ([place])    Lab Results  Component Value Date   HGBA1C 5.9 06/22/2017   Glucose control stable at home  Diet controlled        Relevant Medications   losartan (COZAAR) 100 MG tablet     Musculoskeletal and Integument   Osteoarthritis of left hip    Rev xray done in hospital  Overall improved with PT/OT -using walker at all times and has help if needed         Genitourinary   Acute on chronic kidney failure (HCC) - Primary    Improved Cr at d/c (thought to be from dehydration)  Enc better fluid intake F/u 3 mo  Reviewed hospital records, lab results and studies in detail          Other   HYPERCHOLESTEROLEMIA    Disc goals for lipids and reasons to control them Rev last labs with pt Rev low sat fat diet in detail Will continue zetia  Intol of statins       Relevant Medications   losartan (COZAAR) 100 MG tablet   Anemia    Hb down to 9.7  Likely anemia of chronic dz Feeling better Reviewed hospital records, lab results and studies in detail   Will re check at 3 mo f/u (or earlier if needed if not feeling better)        Obesity    Lost down to 160s in hospital-now up to 171  Fairly stable Enc her to continue PT for exercise and eat well      Elevated troponin    Seen by cardiology in ED Echo was re assuring  Feeling better now  Reviewed hospital records, lab results and studies in detail  Constipation    Stool softener not working  Will try miralax-titrate to effect Also increase fluids most importantly  Will update           Follow Up Instructions: Try miralax for constipation  Increase fluids as much as you can-optimally around 64 oz per day  Start back on zetia Give losartan 1/2 of 100 mg tab for bp over 140/90 (when you need a refill we can send in the 50 mg pills)  Follow up in 3 months if able Continue PT/OT     I discussed the assessment and treatment plan with the patient. The patient was provided an opportunity to ask questions and all were answered. The patient agreed with the plan and demonstrated an understanding of the instructions.   The patient was advised to call back or seek an in-person evaluation if the symptoms worsen or if the condition fails to improve as anticipated.    Loura Pardon, MD

## 2018-10-31 NOTE — Assessment & Plan Note (Signed)
Improved Cr at d/c (thought to be from dehydration)  Enc better fluid intake F/u 3 mo  Reviewed hospital records, lab results and studies in detail

## 2018-10-31 NOTE — Assessment & Plan Note (Signed)
Rev xray done in hospital  Overall improved with PT/OT -using walker at all times and has help if needed

## 2018-10-31 NOTE — Assessment & Plan Note (Signed)
Seen by cardiology in ED Echo was re assuring  Feeling better now  Reviewed hospital records, lab results and studies in detail

## 2018-10-31 NOTE — Assessment & Plan Note (Signed)
Giving 1/2 of the 100 losartan for bp over 140/90  This am given for 150/78 Staying off hctz  Will replace losartan 100 with the 50 mg pill if needed for next refill  Continue to monitor F/u 3 mo

## 2018-11-02 ENCOUNTER — Encounter: Payer: Medicare Other | Admitting: Family Medicine

## 2018-11-03 DIAGNOSIS — M06041 Rheumatoid arthritis without rheumatoid factor, right hand: Secondary | ICD-10-CM | POA: Diagnosis not present

## 2018-11-03 DIAGNOSIS — H409 Unspecified glaucoma: Secondary | ICD-10-CM

## 2018-11-03 DIAGNOSIS — E860 Dehydration: Secondary | ICD-10-CM

## 2018-11-03 DIAGNOSIS — E119 Type 2 diabetes mellitus without complications: Secondary | ICD-10-CM | POA: Diagnosis not present

## 2018-11-03 DIAGNOSIS — F0390 Unspecified dementia without behavioral disturbance: Secondary | ICD-10-CM | POA: Diagnosis not present

## 2018-11-03 DIAGNOSIS — M06042 Rheumatoid arthritis without rheumatoid factor, left hand: Secondary | ICD-10-CM

## 2018-11-03 DIAGNOSIS — D649 Anemia, unspecified: Secondary | ICD-10-CM

## 2018-11-10 ENCOUNTER — Telehealth: Payer: Self-pay | Admitting: Family Medicine

## 2018-11-10 NOTE — Telephone Encounter (Signed)
Please ok those verbal orders  

## 2018-11-10 NOTE — Telephone Encounter (Signed)
Radovann @ [facility] number [phone]  Needs verbal orders OT upper body strength ADL transfer & home safety   1 week 1 2 week 4  Ok to leave message

## 2018-11-10 NOTE — Telephone Encounter (Signed)
LMOVM OK verbal orders.

## 2018-11-24 ENCOUNTER — Telehealth: Payer: Self-pay | Admitting: Family Medicine

## 2018-11-24 NOTE — Telephone Encounter (Signed)
Please ok those verbal orders  

## 2018-11-24 NOTE — Telephone Encounter (Signed)
Left VM giving verbal orders  

## 2018-11-24 NOTE — Telephone Encounter (Signed)
Copied from CRM [id number]. Topic: Quick Communication - Home Health Verbal Orders >> Nov 23, 2018  4:53 PM Dalphine Handing A wrote: Caller/Agency: Benedetto Coons Number: [facility] Requesting OT/PT/Skilled Nursing/Social Work/Speech Therapy: PT Frequency: 2W4 effective 11/28/2018

## 2018-12-08 ENCOUNTER — Telehealth: Payer: Self-pay

## 2018-12-08 NOTE — Telephone Encounter (Signed)
OT with [facility] left v/m that pt is being discharged today for [facility] OT; pt is doing well and has met all goals.Pt will continue PT for balance and strengthening. No CB needed.

## 2018-12-22 ENCOUNTER — Telehealth: Payer: Self-pay | Admitting: Family Medicine

## 2018-12-22 NOTE — Telephone Encounter (Signed)
Please ok those verbal orders  

## 2018-12-22 NOTE — Telephone Encounter (Signed)
Mikle Bosworth, [facility],called.  He needs physical therapy orders 2 x a week for 4 weeks beginning 12/26/18.

## 2018-12-23 NOTE — Telephone Encounter (Signed)
Verbal order given to [facility]

## 2019-01-15 DIAGNOSIS — M06041 Rheumatoid arthritis without rheumatoid factor, right hand: Secondary | ICD-10-CM

## 2019-01-15 DIAGNOSIS — D649 Anemia, unspecified: Secondary | ICD-10-CM

## 2019-01-15 DIAGNOSIS — E860 Dehydration: Secondary | ICD-10-CM

## 2019-01-15 DIAGNOSIS — H409 Unspecified glaucoma: Secondary | ICD-10-CM

## 2019-01-15 DIAGNOSIS — E119 Type 2 diabetes mellitus without complications: Secondary | ICD-10-CM

## 2019-01-15 DIAGNOSIS — F0390 Unspecified dementia without behavioral disturbance: Secondary | ICD-10-CM | POA: Diagnosis not present

## 2019-01-15 DIAGNOSIS — M06042 Rheumatoid arthritis without rheumatoid factor, left hand: Secondary | ICD-10-CM

## 2019-02-14 ENCOUNTER — Ambulatory Visit (INDEPENDENT_AMBULATORY_CARE_PROVIDER_SITE_OTHER): Payer: Medicare Other | Admitting: Family Medicine

## 2019-02-14 ENCOUNTER — Encounter: Payer: Self-pay | Admitting: Family Medicine

## 2019-02-14 VITALS — BP 135/71 | HR 67 | Temp 97.2°F | Ht 59.75 in | Wt 170.0 lb

## 2019-02-14 DIAGNOSIS — N289 Disorder of kidney and ureter, unspecified: Secondary | ICD-10-CM

## 2019-02-14 DIAGNOSIS — I10 Essential (primary) hypertension: Secondary | ICD-10-CM | POA: Diagnosis not present

## 2019-02-14 DIAGNOSIS — E1142 Type 2 diabetes mellitus with diabetic polyneuropathy: Secondary | ICD-10-CM | POA: Diagnosis not present

## 2019-02-14 DIAGNOSIS — I739 Peripheral vascular disease, unspecified: Secondary | ICD-10-CM

## 2019-02-14 DIAGNOSIS — E66811 Obesity, class 1: Secondary | ICD-10-CM

## 2019-02-14 DIAGNOSIS — D649 Anemia, unspecified: Secondary | ICD-10-CM

## 2019-02-14 DIAGNOSIS — R413 Other amnesia: Secondary | ICD-10-CM

## 2019-02-14 DIAGNOSIS — E6609 Other obesity due to excess calories: Secondary | ICD-10-CM

## 2019-02-14 DIAGNOSIS — E1122 Type 2 diabetes mellitus with diabetic chronic kidney disease: Secondary | ICD-10-CM | POA: Diagnosis not present

## 2019-02-14 DIAGNOSIS — E7800 Pure hypercholesterolemia, unspecified: Secondary | ICD-10-CM

## 2019-02-14 DIAGNOSIS — Z6832 Body mass index (BMI) 32.0-32.9, adult: Secondary | ICD-10-CM

## 2019-02-14 MED ORDER — LOSARTAN POTASSIUM 50 MG PO TABS: 50.0000 mg | ORAL_TABLET | Freq: Every day | ORAL | 3 refills | Status: DC

## 2019-02-14 MED ORDER — EZETIMIBE 10 MG PO TABS: 10.0000 mg | ORAL_TABLET | Freq: Every day | ORAL | 3 refills | Status: DC

## 2019-02-14 NOTE — Patient Instructions (Addendum)
Continue current medicines Try to stick to a diabetic diet  The office will call you to schedule lab draw for sept and hope you can get a flu shot then also Stay mentally and physically active

## 2019-02-14 NOTE — Assessment & Plan Note (Signed)
Per daughter=gradually worsening  Not agitated Declines medication like aricept or namenda  Family cares for her No wandering or safety issues

## 2019-02-14 NOTE — Assessment & Plan Note (Signed)
Noted in past by podiatry  No claudication or symptoms currently

## 2019-02-14 NOTE — Progress Notes (Signed)
Virtual Visit via Video Note  I connected with Kristina Bennett on 02/14/19 at  3:15 PM EDT by a video enabled telemedicine application and verified that I am speaking with the correct person using two identifiers.  Location: Patient: home Provider: office    I discussed the limitations of evaluation and management by telemedicine and the availability of in person appointments. The patient expressed understanding and agreed to proceed.  History of Present Illness: Pt presents for f/u of chronic medical problems  Feeling ok  Nothing new  She is glad therapy is over- it was helpful  Uses walker all the time now   Some pain in R knee Hip is improve  Tylenol helps   Appetite is ok  Cooking at home Some carry out    Weight  Wt Readings from Last 3 Encounters:  02/14/19 170 lb (77.1 kg)  10/31/18 171 lb (77.6 kg)  09/28/18 166 lb 6.2 oz (75.5 kg)   33.48 kg/m  Hypertension   BP Readings from Last 3 Encounters:  02/14/19 135/71  10/31/18 (!) 150/78  09/29/18 (!) 127/46   Last time was giving 1/2 of the losartan 100 for bp over 140/90 Off hctz   DM2 Lab Results  Component Value Date   HGBA1C 5.9 06/22/2017  glucose pp today was 124    Renal insuff  Lab Results  Component Value Date   CREATININE 1.17 (H) 09/29/2018   BUN 46 (H) 09/26/2018   NA 138 09/26/2018   K 3.8 09/26/2018   CL 110 09/26/2018   CO2 22 09/26/2018  fluid intake is much better- daughter keeps her drinking a lot  Also some diet drinks  Gets up to urinate frequently     Hyperlipidemia Lab Results  Component Value Date   CHOL 195 06/22/2017   HDL 52.60 06/22/2017   LDLCALC 125 (H) 06/22/2017   LDLDIRECT 144.0 10/31/2014   TRIG 87.0 06/22/2017   CHOLHDL 4 06/22/2017   zetia and diet  Intol of statins   Anemia  Lab Results  Component Value Date   WBC 5.0 09/29/2018   HGB 9.7 (L) 09/29/2018   HCT 30.2 (L) 09/29/2018   MCV 97.1 09/29/2018   PLT 109 (L) 09/29/2018    Chronic  dz Due for labs  Energy level is ok/ stamina is a little better than last time   Dozes once 20 min max during the day    Memory is worse with time  Does not remember me/her doctor office  Declines aricept of namenda at this time   Patient Active Problem List   Diagnosis Date Noted  . PVD (peripheral vascular disease) (Kristina Bennett) 02/14/2019  . Constipation 10/31/2018  . Osteoarthritis of left hip 10/31/2018  . Short-term memory loss 10/31/2018  . Acute on chronic kidney failure (Kristina Bennett) 09/25/2018  . Elevated troponin 09/25/2018  . Obesity 11/05/2015  . Routine general medical examination at a health care facility 05/07/2015  . Encounter for Medicare annual wellness exam 05/01/2014  . Pre-ulcerative calluses 11/01/2013  . Pedal edema 11/14/2009  . Renal insufficiency 09/24/2009  . VARICOSE VEINS, LOWER EXTREMITIES 12/05/2008  . Anemia 07/05/2007  . Diabetes type 2, controlled ([place]) 04/08/2007  . Diabetic nephropathy (Kristina Bennett) 04/08/2007  . Diabetic neuropathy (Kristina Bennett) 04/08/2007  . HYPERCHOLESTEROLEMIA 04/08/2007  . Macular degeneration (senile) of retina 04/08/2007  . Essential hypertension 04/08/2007  . URINARY INCONTINENCE 04/08/2007  . NEPHROLITHIASIS, HX OF 04/08/2007   Past Medical History:  Diagnosis Date  . Anemia   .  Anemia   . Diabetes mellitus   . DM2 (diabetes mellitus, type 2) (Kristina Bennett)   . HTN (hypertension)   . Kidney congenitally absent, left    small  . Macular degeneration   . Macular degeneration   . Nephrolithiasis    hx  . Renal insufficiency    poss renal vasc dz  . Renal insufficiency   . Rheumatoid arthritis(714.0)   . Rheumatoid arthritis(714.0)   . Umbilical hernia   . Urinary incontinence    Past Surgical History:  Procedure Laterality Date  . 411 echo     mild diastolic dysfunction EF 16%, slt pulm HTN   . cataract signs     Social History   Tobacco Use  . Smoking status: Never Smoker  . Smokeless tobacco: Never Used  Substance Use Topics  .  Alcohol use: No    Alcohol/week: 0.0 standard drinks  . Drug use: No   Family History  Problem Relation Age of Onset  . Heart attack Mother   . Prostate cancer Father    Allergies  Allergen Reactions  . Etodolac Shortness Of Breath and Other (See Comments)    REACTION: dizziness  . Ace Inhibitors Other (See Comments)    REACTION: unknown  . Atorvastatin Other (See Comments)    Reaction: muscle aches   . Glimepiride Other (See Comments)    Reaction: hypoglycemia  . Glipizide Other (See Comments)    REACTION: low sugar  . Metformin Other (See Comments)    REACTION: increased creatinine  . Metoprolol Tartrate Other (See Comments)    Reaction: unknown  . Propoxyphene N-Acetaminophen Other (See Comments)    Reaction: unknown  . Rosuvastatin Other (See Comments)    Reaction: muscle aches    Current Outpatient Medications on File Prior to Visit  Medication Sig Dispense Refill  . Blood Glucose Monitoring Suppl (CONTOUR BLOOD GLUCOSE SYSTEM) W/DEVICE KIT Use to check blood sugar once daily     . Cholecalciferol (VITAMIN D3) 50 MCG (2000 UT) capsule Take 2,000 Units by mouth daily.    Kristina Bennett Calcium (STOOL SOFTENER PO) Take 1 tablet by mouth daily as needed.    Marland Kitchen glucose blood (BAYER CONTOUR TEST) test strip 1 each by Other route daily. Use as instructed      No current facility-administered medications on file prior to visit.     Review of Systems  Constitutional: Negative for chills, fever, malaise/fatigue and weight loss.  HENT: Negative for sore throat.   Eyes: Negative for blurred vision, discharge and redness.  Respiratory: Negative for cough and shortness of breath.   Cardiovascular: Negative for chest pain and palpitations.  Gastrointestinal: Negative for heartburn and nausea.  Genitourinary:       Overactive bladder  Musculoskeletal: Positive for back pain.  Skin: Negative for rash.  Neurological: Negative for dizziness and headaches.  Endo/Heme/Allergies:  Negative for polydipsia.  Psychiatric/Behavioral: Positive for memory loss. Negative for depression. The patient is not nervous/anxious.       Observations/Objective: Patient appears well, in no distress Weight is baseline (obese) No facial swelling or asymmetry Normal voice-not hoarse and no slurred speech No obvious tremor or mobility impairment Moving neck and UEs normally Able to hear the call well  No cough or shortness of breath during interview  Quiet with adv of memory loss, daughter helps with history today No skin changes on face or neck , no rash or pallor Affect is normal    Assessment and Plan: Problem List Items Addressed  This Visit      Cardiovascular and Mediastinum   Essential hypertension - Primary    bp in fair control at this time  BP Readings from Last 1 Encounters:  02/14/19 135/71   No changes needed Most recent labs reviewed  Disc lifstyle change with low sodium diet and exercise        Relevant Medications   losartan (COZAAR) 50 MG tablet   ezetimibe (ZETIA) 10 MG tablet   PVD (peripheral vascular disease) ([place])    Noted in past by podiatry  No claudication or symptoms currently      Relevant Medications   losartan (COZAAR) 50 MG tablet   ezetimibe (ZETIA) 10 MG tablet     Endocrine   Diabetes type 2, controlled ([place])    Overdue for a1c Glucose results at home are good  Eye and foot exams postponed for pandemic  Arb for renal protection  Statin intolerant       Relevant Medications   losartan (COZAAR) 50 MG tablet     Nervous and Auditory   Diabetic neuropathy (HCC)    Now some in fingers In the past gabapentin was too sedating  Pt tolerates Stressed importance of good glucose control      Relevant Medications   losartan (COZAAR) 50 MG tablet     Genitourinary   Renal insufficiency    Due for renal panel Will set up for sept  Drinking more fluids and feeling better         Other   HYPERCHOLESTEROLEMIA    Disc goals  for lipids and reasons to control them Rev last labs with pt Rev low sat fat diet in detail Planning lab for sept  On zetia  Statin intol  LDL in 120s=goal is under 70      Relevant Medications   losartan (COZAAR) 50 MG tablet   ezetimibe (ZETIA) 10 MG tablet   Anemia    Needs labs Family willing to bring her in sept (also for flu shot) Some improvement in energy  Has anemia ov chronic dz      Obesity    Discussed how this problem influences overall health and the risks it imposes  Reviewed plan for weight loss with lower calorie diet (via better food choices and also portion control or program like weight watchers) and exercise building up to or more than 30 minutes 5 days per week including some aerobic activity         Short-term memory loss    Per daughter=gradually worsening  Not agitated Declines medication like aricept or namenda  Family cares for her No wandering or safety issues           Follow Up Instructions: Continue current medicines Try to stick to a diabetic diet  The office will call you to schedule lab draw for sept and hope you can get a flu shot then also Stay mentally and physically active    I discussed the assessment and treatment plan with the patient. The patient was provided an opportunity to ask questions and all were answered. The patient agreed with the plan and demonstrated an understanding of the instructions.   The patient was advised to call back or seek an in-person evaluation if the symptoms worsen or if the condition fails to improve as anticipated.     Loura Pardon, MD

## 2019-02-14 NOTE — Assessment & Plan Note (Signed)
Due for renal panel Will set up for sept  Drinking more fluids and feeling better

## 2019-02-14 NOTE — Assessment & Plan Note (Signed)
Discussed how this problem influences overall health and the risks it imposes  Reviewed plan for weight loss with lower calorie diet (via better food choices and also portion control or program like weight watchers) and exercise building up to or more than 30 minutes 5 days per week including some aerobic activity    

## 2019-02-14 NOTE — Assessment & Plan Note (Signed)
Needs labs Family willing to bring her in sept (also for flu shot) Some improvement in energy  Has anemia ov chronic dz

## 2019-02-14 NOTE — Assessment & Plan Note (Signed)
bp in fair control at this time  BP Readings from Last 1 Encounters:  02/14/19 135/71   No changes needed Most recent labs reviewed  Disc lifstyle change with low sodium diet and exercise

## 2019-02-14 NOTE — Assessment & Plan Note (Signed)
Disc goals for lipids and reasons to control them Rev last labs with pt Rev low sat fat diet in detail Planning lab for sept  On zetia  Statin intol  LDL in 120s=goal is under 70

## 2019-02-14 NOTE — Assessment & Plan Note (Signed)
Overdue for a1c Glucose results at home are good  Eye and foot exams postponed for pandemic  Arb for renal protection  Statin intolerant

## 2019-02-14 NOTE — Assessment & Plan Note (Signed)
Now some in fingers In the past gabapentin was too sedating  Pt tolerates Stressed importance of good glucose control

## 2019-04-07 ENCOUNTER — Telehealth: Payer: Self-pay

## 2019-04-07 NOTE — Telephone Encounter (Signed)
Agree with advisement Will be on the look out for records

## 2019-04-07 NOTE — Telephone Encounter (Signed)
Vaughan Basta (DPR signed) left v/m that today pt has rash that looks like blisters on rt side of forehead and corners of rt eyebrow near the rt eye. Vaughan Basta thinks it looks like shingles. I advised since Fri afternoon after 4:20 pm Vaughan Basta should take  Pt to UC for eval since rash is so close to pts eye. Vaughan Basta will take pt to UC in [place]; FYI to Dr Glori Bickers.

## 2019-04-10 ENCOUNTER — Telehealth: Payer: Self-pay | Admitting: Family Medicine

## 2019-04-10 ENCOUNTER — Telehealth: Payer: Self-pay

## 2019-04-10 DIAGNOSIS — E7800 Pure hypercholesterolemia, unspecified: Secondary | ICD-10-CM

## 2019-04-10 DIAGNOSIS — D649 Anemia, unspecified: Secondary | ICD-10-CM

## 2019-04-10 DIAGNOSIS — E1122 Type 2 diabetes mellitus with diabetic chronic kidney disease: Secondary | ICD-10-CM

## 2019-04-10 DIAGNOSIS — I10 Essential (primary) hypertension: Secondary | ICD-10-CM

## 2019-04-10 NOTE — Telephone Encounter (Signed)
It is fine to get labs and flu shot unless she feels to poorly to do it.  They can decide Thanks  Hope she feels better soon

## 2019-04-10 NOTE — Telephone Encounter (Signed)
Linda notified as instructed by telephone and verbalized understanding. Vaughan Basta stated that she thinks that it is best to cancel the appointments tomorrow. Vaughan Basta stated that she will call back and schedule the appointments in a couple weeks when patient is feeling better. Appointments cancelled.

## 2019-04-10 NOTE — Telephone Encounter (Signed)
-----   Message from Ellamae Sia sent at 04/04/2019 11:21 AM EDT ----- Regarding: Lab orders for Tuesday, 9.22.20 Lab orders

## 2019-04-10 NOTE — Telephone Encounter (Signed)
Vaughan Basta (DPR signed) left v/m that pt did go to UC and got abx and pt does have shingles on her face. Vaughan Basta wants to know if Dr Glori Bickers wants pt to keep appt for labs and flu shot on 04/11/19 at [facility] or should pt reschedule due to having shingles. Linda request cb.

## 2019-04-11 ENCOUNTER — Ambulatory Visit: Payer: Medicare Other

## 2019-04-11 ENCOUNTER — Other Ambulatory Visit: Payer: Medicare Other

## 2019-05-02 ENCOUNTER — Other Ambulatory Visit (INDEPENDENT_AMBULATORY_CARE_PROVIDER_SITE_OTHER): Payer: Medicare Other

## 2019-05-02 ENCOUNTER — Ambulatory Visit (INDEPENDENT_AMBULATORY_CARE_PROVIDER_SITE_OTHER): Payer: Medicare Other

## 2019-05-02 DIAGNOSIS — I10 Essential (primary) hypertension: Secondary | ICD-10-CM | POA: Diagnosis not present

## 2019-05-02 DIAGNOSIS — E7800 Pure hypercholesterolemia, unspecified: Secondary | ICD-10-CM

## 2019-05-02 DIAGNOSIS — E1122 Type 2 diabetes mellitus with diabetic chronic kidney disease: Secondary | ICD-10-CM | POA: Diagnosis not present

## 2019-05-02 DIAGNOSIS — D649 Anemia, unspecified: Secondary | ICD-10-CM | POA: Diagnosis not present

## 2019-05-02 DIAGNOSIS — Z23 Encounter for immunization: Secondary | ICD-10-CM

## 2019-05-02 LAB — COMPREHENSIVE METABOLIC PANEL
ALT: 7 U/L (ref 0–35)
AST: 15 U/L (ref 0–37)
Albumin: 3.9 g/dL (ref 3.5–5.2)
Alkaline Phosphatase: 64 U/L (ref 39–117)
BUN: 23 mg/dL (ref 6–23)
CO2: 30 mEq/L (ref 19–32)
Calcium: 9.7 mg/dL (ref 8.4–10.5)
Chloride: 102 mEq/L (ref 96–112)
Creatinine, Ser: 0.93 mg/dL (ref 0.40–1.20)
GFR: 57.07 mL/min — ABNORMAL LOW (ref >60.00)
Glucose, Bld: 124 mg/dL — ABNORMAL HIGH (ref 70–99)
Potassium: 4.4 mEq/L (ref 3.5–5.1)
Sodium: 138 mEq/L (ref 135–145)
Total Bilirubin: 0.7 mg/dL (ref 0.2–1.2)
Total Protein: 6.9 g/dL (ref 6.0–8.3)

## 2019-05-02 LAB — CBC WITH DIFFERENTIAL/PLATELET
Basophils Absolute: 0 10*3/uL (ref 0.0–0.1)
Basophils Relative: 0.5 % (ref 0.0–3.0)
Eosinophils Absolute: 0.1 10*3/uL (ref 0.0–0.7)
Eosinophils Relative: 2.9 % (ref 0.0–5.0)
HCT: 34.7 % — ABNORMAL LOW (ref 36.0–46.0)
Hemoglobin: 11.8 g/dL — ABNORMAL LOW (ref 12.0–15.0)
Lymphocytes Relative: 29.3 % (ref 12.0–46.0)
Lymphs Abs: 1.5 10*3/uL (ref 0.7–4.0)
MCHC: 33.9 g/dL (ref 30.0–36.0)
MCV: 92.2 fl (ref 78.0–100.0)
Monocytes Absolute: 0.5 10*3/uL (ref 0.1–1.0)
Monocytes Relative: 10.6 % (ref 3.0–12.0)
Neutro Abs: 2.9 10*3/uL (ref 1.4–7.7)
Neutrophils Relative %: 56.7 % (ref 43.0–77.0)
Platelets: 148 10*3/uL — ABNORMAL LOW (ref 150.0–400.0)
RBC: 3.76 Mil/uL — ABNORMAL LOW (ref 3.87–5.11)
RDW: 15.1 % (ref 11.5–15.5)
WBC: 5.1 10*3/uL (ref 4.0–10.5)

## 2019-05-02 LAB — LIPID PANEL
Cholesterol: 219 mg/dL — ABNORMAL HIGH (ref 0–200)
HDL: 54 mg/dL (ref >39.00)
LDL Cholesterol: 145 mg/dL — ABNORMAL HIGH (ref 0–99)
NonHDL: 164.86
Total CHOL/HDL Ratio: 4
Triglycerides: 100 mg/dL (ref 0.0–149.0)
VLDL: 20 mg/dL (ref 0.0–40.0)

## 2019-05-02 LAB — TSH: TSH: 0.72 u[IU]/mL (ref 0.35–4.50)

## 2019-05-02 LAB — FERRITIN: Ferritin: 141.3 ng/mL (ref 10.0–291.0)

## 2019-05-02 LAB — HEMOGLOBIN A1C: Hgb A1c MFr Bld: 6 % (ref 4.6–6.5)

## 2019-11-06 IMAGING — CT CT HEAD WITHOUT CONTRAST
3 series · 15 of 46 positions shown, 18 images · non-contrast
Comparison: None.

CLINICAL DATA: Weakness and difficulty walking

EXAM:
CT HEAD WITHOUT CONTRAST
TECHNIQUE: Contiguous axial images were obtained from the base of the skull
through the vertex without intravenous contrast.

[Series 2: head wo · axial · 0.47mm/px · z∈[-141,-21]mm · 9 of 29 slices shown, 12 images]
[im 3/29  brain]
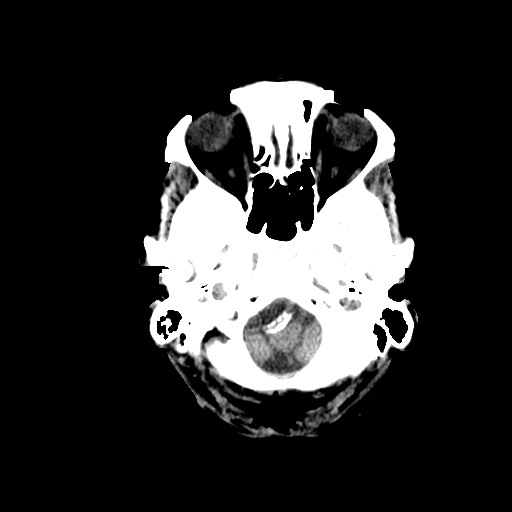
[im 3/29  bone]
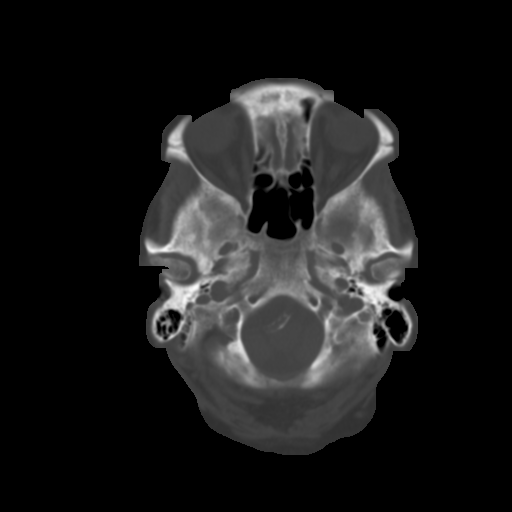
[im 6/29  brain]
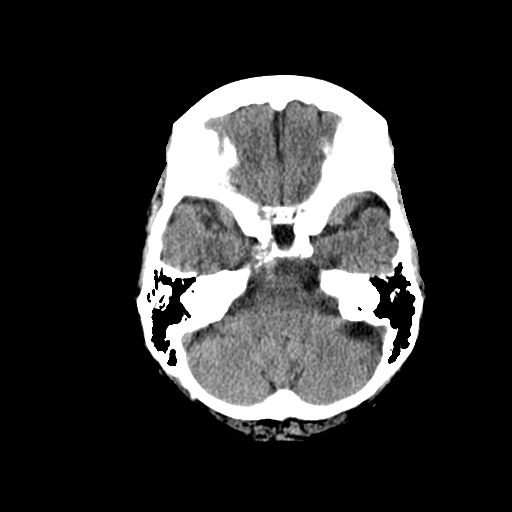
[im 9/29  brain]
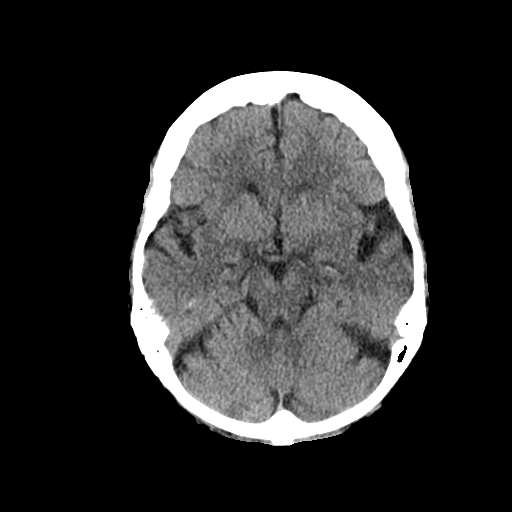
[im 12/29  brain]
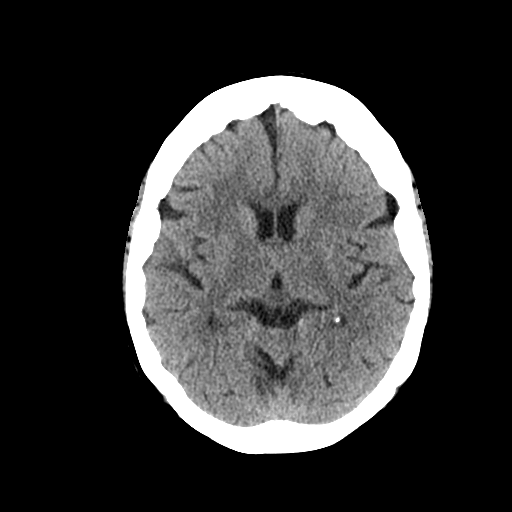
[im 15/29  brain]
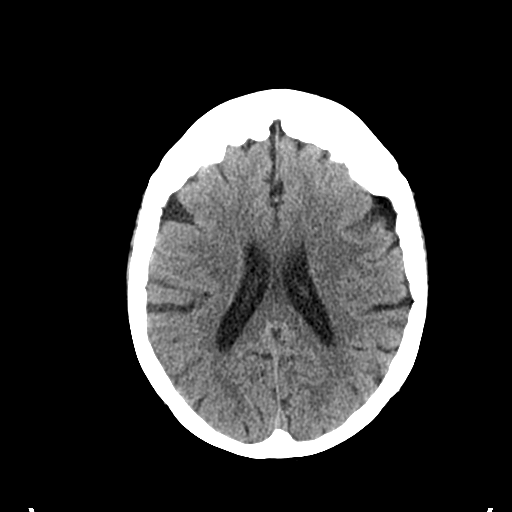
[im 15/29  bone]
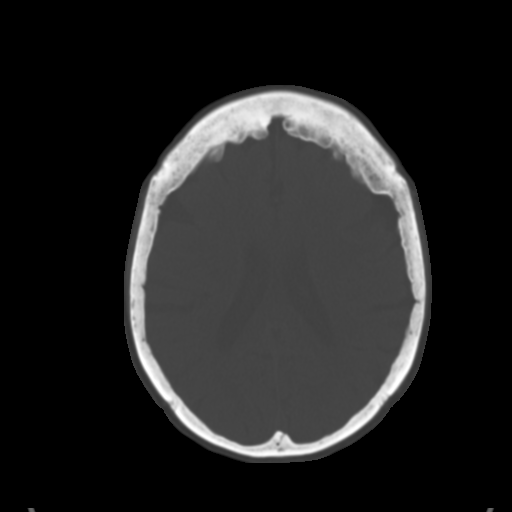
[im 18/29  brain]
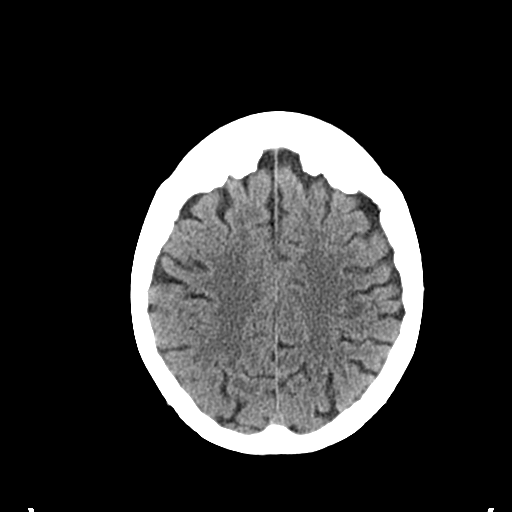
[im 21/29  brain]
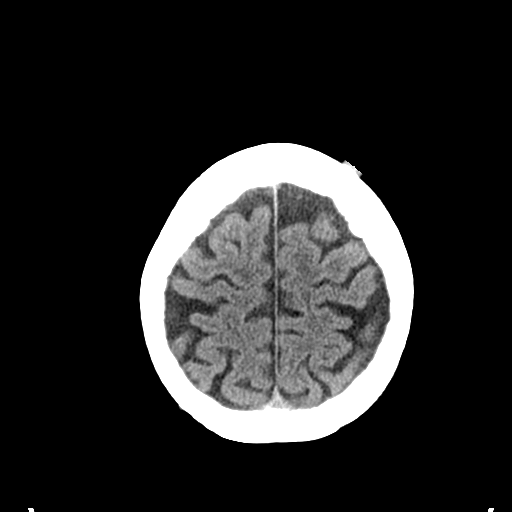
[im 24/29  brain]
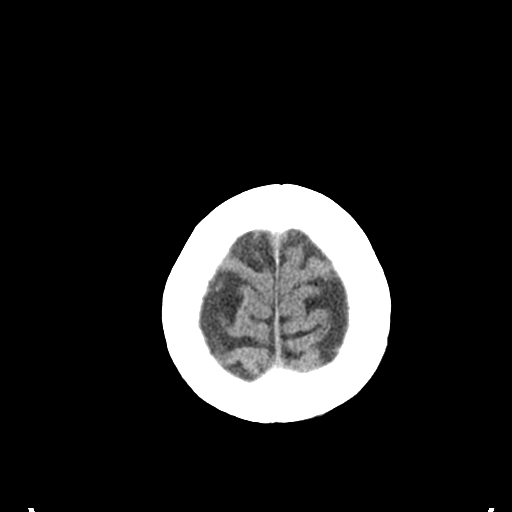
[im 27/29  brain]
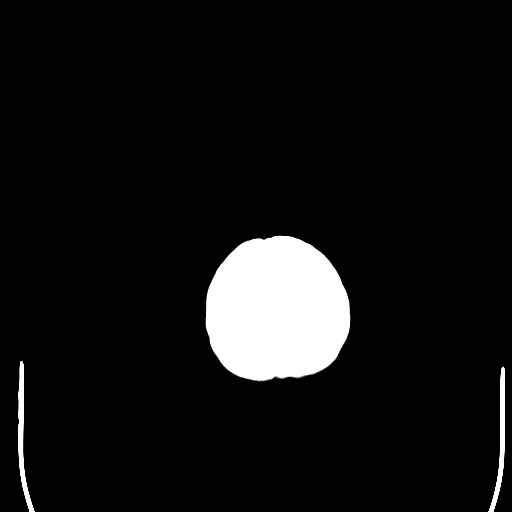
[im 27/29  bone]
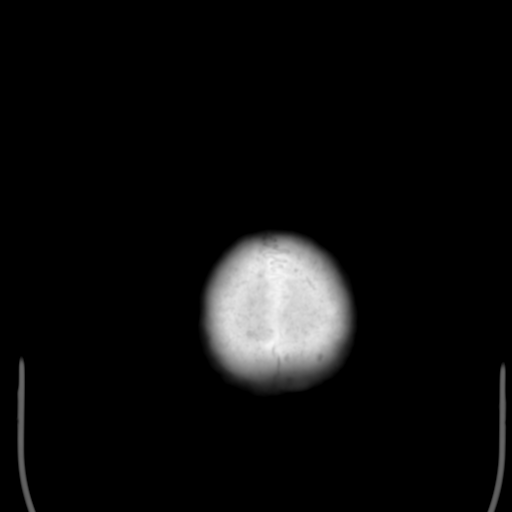

[Series 4: coronal soft tissue · coronal · 0.28mm/px · 3 of 62 slices shown]
[im 21/62  brain]
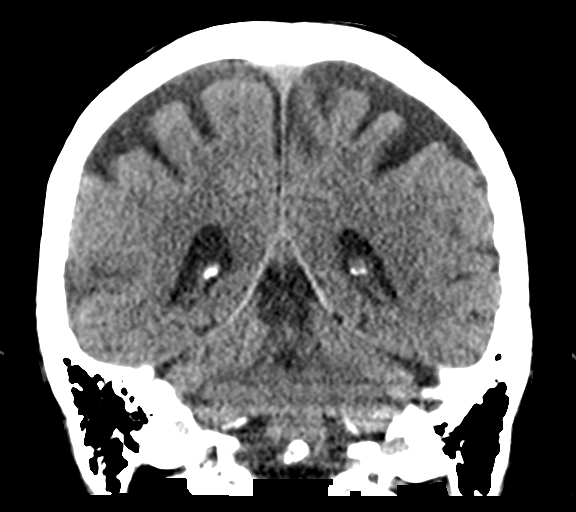
[im 28/62  brain]
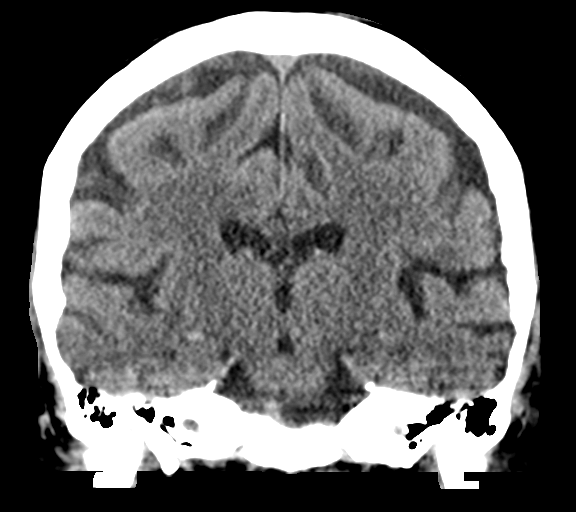
[im 34/62  brain]
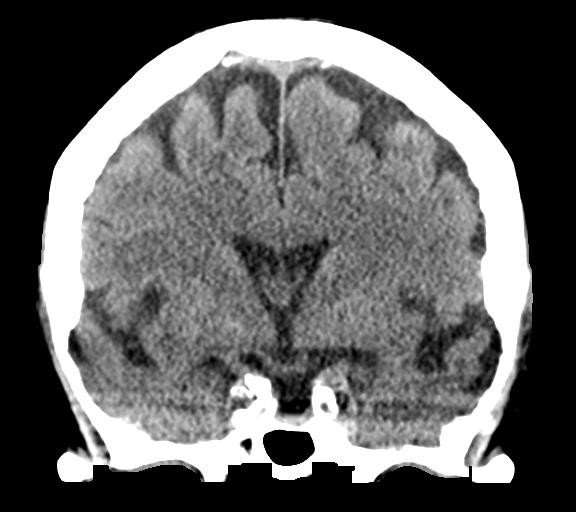

[Series 5: sagittal soft tissue · sagittal · 0.28mm/px · 3 of 53 slices shown]
[im 18/53  brain]
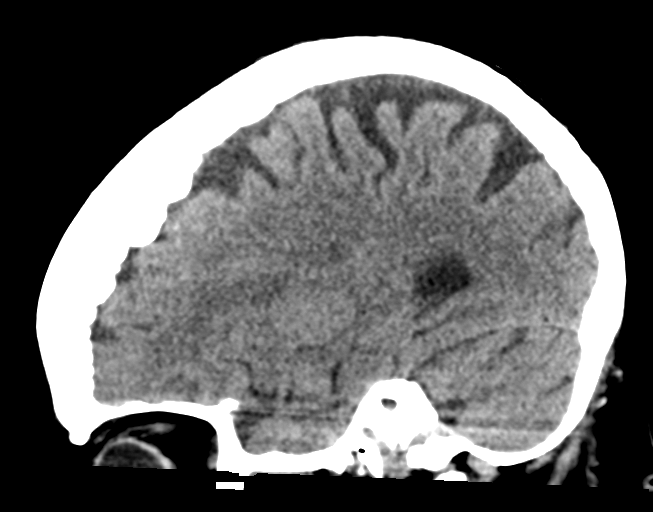
[im 27/53  brain]
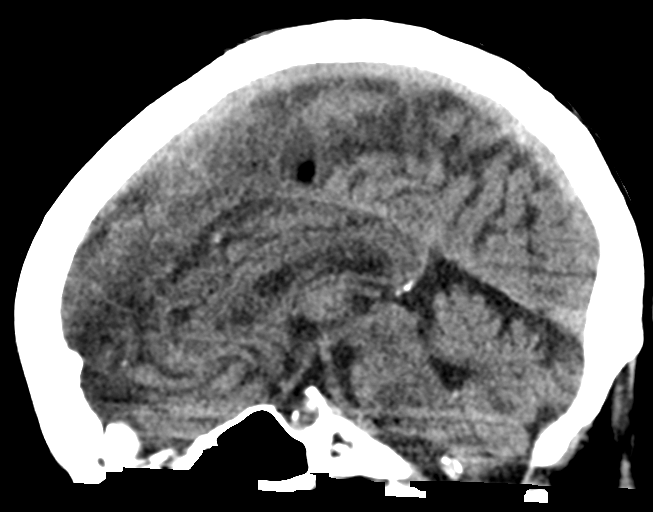
[im 35/53  brain]
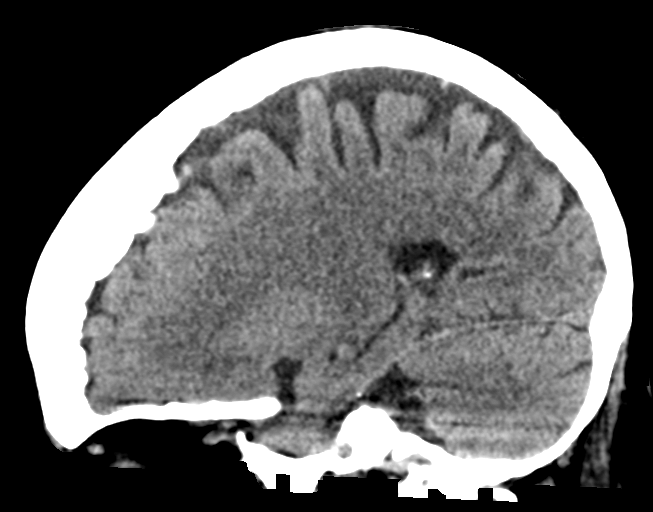

[15 of 46 positions shown; findings below may reference images not displayed]

FINDINGS: Brain: There is no mass, hemorrhage or extra-axial collection. The
size and configuration of the ventricles and extra-axial CSF spaces
are normal. The brain parenchyma is normal, without acute or chronic
infarction.

Vascular: Atherosclerotic calcification of the vertebral and
internal carotid arteries at the skull base. No abnormal
hyperdensity of the major intracranial arteries or dural venous
sinuses.

Skull: The visualized skull base, calvarium and extracranial soft
tissues are normal.

Sinuses/Orbits: No fluid levels or advanced mucosal thickening of
the visualized paranasal sinuses. No mastoid or middle ear effusion.
The orbits are normal.
IMPRESSION: 1. Normal aging brain.
2. Intracranial atherosclerosis.

## 2020-03-20 ENCOUNTER — Other Ambulatory Visit: Payer: Self-pay

## 2020-03-20 ENCOUNTER — Ambulatory Visit (INDEPENDENT_AMBULATORY_CARE_PROVIDER_SITE_OTHER): Payer: Medicare Other

## 2020-03-20 DIAGNOSIS — Z0000 Encounter for general adult medical examination without abnormal findings: Secondary | ICD-10-CM | POA: Diagnosis not present

## 2020-03-20 NOTE — Patient Instructions (Signed)
Kristina Bennett , Thank you for taking time to come for your Medicare Wellness Visit. I appreciate your ongoing commitment to your health goals. Please review the following plan we discussed and let me know if I can assist you in the future.   Screening recommendations/referrals: Colonoscopy: no longer required Mammogram: declined Bone Density: declined Recommended yearly ophthalmology/optometry visit for glaucoma screening and checkup Recommended yearly dental visit for hygiene and checkup  Vaccinations: Influenza vaccine: due, will get next week at physical Pneumococcal vaccine: Completed series Tdap vaccine: Up to date, completed 06/12/2011, due 05/2021 Shingles vaccine: due, check with your insurance regarding coverage   Covid-19:Completed series  Advanced directives: copy in chart  Conditions/risks identified: diabetes, hypercholesterolemia  Next appointment: Follow up in one year for your annual wellness visit    Preventive Care 65 Years and Older, Female Preventive care refers to lifestyle choices and visits with your health care provider that can promote health and wellness. What does preventive care include?  A yearly physical exam. This is also called an annual well check.  Dental exams once or twice a year.  Routine eye exams. Ask your health care provider how often you should have your eyes checked.  Personal lifestyle choices, including:  Daily care of your teeth and gums.  Regular physical activity.  Eating a healthy diet.  Avoiding tobacco and drug use.  Limiting alcohol use.  Practicing safe sex.  Taking low-dose aspirin every day.  Taking vitamin and mineral supplements as recommended by your health care provider. What happens during an annual well check? The services and screenings done by your health care provider during your annual well check will depend on your age, overall health, lifestyle risk factors, and family history of disease. Counseling   Your health care provider may ask you questions about your:  Alcohol use.  Tobacco use.  Drug use.  Emotional well-being.  Home and relationship well-being.  Sexual activity.  Eating habits.  History of falls.  Memory and ability to understand (cognition).  Work and work Astronomer.  Reproductive health. Screening  You may have the following tests or measurements:  Height, weight, and BMI.  Blood pressure.  Lipid and cholesterol levels. These may be checked every 5 years, or more frequently if you are over 84 years old.  Skin check.  Lung cancer screening. You may have this screening every year starting at age 84 if you have a 30-pack-year history of smoking and currently smoke or have quit within the past 15 years.  Fecal occult blood test (FOBT) of the stool. You may have this test every year starting at age 84.  Flexible sigmoidoscopy or colonoscopy. You may have a sigmoidoscopy every 5 years or a colonoscopy every 10 years starting at age 18.  Hepatitis C blood test.  Hepatitis B blood test.  Sexually transmitted disease (STD) testing.  Diabetes screening. This is done by checking your blood sugar (glucose) after you have not eaten for a while (fasting). You may have this done every 1-3 years.  Bone density scan. This is done to screen for osteoporosis. You may have this done starting at age 84.  Mammogram. This may be done every 1-2 years. Talk to your health care provider about how often you should have regular mammograms. Talk with your health care provider about your test results, treatment options, and if necessary, the need for more tests. Vaccines  Your health care provider may recommend certain vaccines, such as:  Influenza vaccine. This is recommended every  year.  Tetanus, diphtheria, and acellular pertussis (Tdap, Td) vaccine. You may need a Td booster every 10 years.  Zoster vaccine. You may need this after age 20.  Pneumococcal 13-valent  conjugate (PCV13) vaccine. One dose is recommended after age 63.  Pneumococcal polysaccharide (PPSV23) vaccine. One dose is recommended after age 62. Talk to your health care provider about which screenings and vaccines you need and how often you need them. This information is not intended to replace advice given to you by your health care provider. Make sure you discuss any questions you have with your health care provider. Document Released: 08/02/2015 Document Revised: 03/25/2016 Document Reviewed: 05/07/2015 Elsevier Interactive Patient Education  2017 [place] Prevention in the Home Falls can cause injuries. They can happen to people of all ages. There are many things you can do to make your home safe and to help prevent falls. What can I do on the outside of my home?  Regularly fix the edges of walkways and driveways and fix any cracks.  Remove anything that might make you trip as you walk through a door, such as a raised step or threshold.  Trim any bushes or trees on the path to your home.  Use bright outdoor lighting.  Clear any walking paths of anything that might make someone trip, such as rocks or tools.  Regularly check to see if handrails are loose or broken. Make sure that both sides of any steps have handrails.  Any raised decks and porches should have guardrails on the edges.  Have any leaves, snow, or ice cleared regularly.  Use sand or salt on walking paths during winter.  Clean up any spills in your garage right away. This includes oil or grease spills. What can I do in the bathroom?  Use night lights.  Install grab bars by the toilet and in the tub and shower. Do not use towel bars as grab bars.  Use non-skid mats or decals in the tub or shower.  If you need to sit down in the shower, use a plastic, non-slip stool.  Keep the floor dry. Clean up any water that spills on the floor as soon as it happens.  Remove soap buildup in the tub or  shower regularly.  Attach bath mats securely with double-sided non-slip rug tape.  Do not have throw rugs and other things on the floor that can make you trip. What can I do in the bedroom?  Use night lights.  Make sure that you have a light by your bed that is easy to reach.  Do not use any sheets or blankets that are too big for your bed. They should not hang down onto the floor.  Have a firm chair that has side arms. You can use this for support while you get dressed.  Do not have throw rugs and other things on the floor that can make you trip. What can I do in the kitchen?  Clean up any spills right away.  Avoid walking on wet floors.  Keep items that you use a lot in easy-to-reach places.  If you need to reach something above you, use a strong step stool that has a grab bar.  Keep electrical cords out of the way.  Do not use floor polish or wax that makes floors slippery. If you must use wax, use non-skid floor wax.  Do not have throw rugs and other things on the floor that can make you trip. What can  I do with my stairs?  Do not leave any items on the stairs.  Make sure that there are handrails on both sides of the stairs and use them. Fix handrails that are broken or loose. Make sure that handrails are as long as the stairways.  Check any carpeting to make sure that it is firmly attached to the stairs. Fix any carpet that is loose or worn.  Avoid having throw rugs at the top or bottom of the stairs. If you do have throw rugs, attach them to the floor with carpet tape.  Make sure that you have a light switch at the top of the stairs and the bottom of the stairs. If you do not have them, ask someone to add them for you. What else can I do to help prevent falls?  Wear shoes that:  Do not have high heels.  Have rubber bottoms.  Are comfortable and fit you well.  Are closed at the toe. Do not wear sandals.  If you use a stepladder:  Make sure that it is fully  opened. Do not climb a closed stepladder.  Make sure that both sides of the stepladder are locked into place.  Ask someone to hold it for you, if possible.  Clearly mark and make sure that you can see:  Any grab bars or handrails.  First and last steps.  Where the edge of each step is.  Use tools that help you move around (mobility aids) if they are needed. These include:  Canes.  Walkers.  Scooters.  Crutches.  Turn on the lights when you go into a dark area. Replace any light bulbs as soon as they burn out.  Set up your furniture so you have a clear path. Avoid moving your furniture around.  If any of your floors are uneven, fix them.  If there are any pets around you, be aware of where they are.  Review your medicines with your doctor. Some medicines can make you feel dizzy. This can increase your chance of falling. Ask your doctor what other things that you can do to help prevent falls. This information is not intended to replace advice given to you by your health care provider. Make sure you discuss any questions you have with your health care provider. Document Released: 05/02/2009 Document Revised: 12/12/2015 Document Reviewed: 08/10/2014 Elsevier Interactive Patient Education  2017 [employer].

## 2020-03-20 NOTE — Progress Notes (Signed)
Subjective:   Kristina Bennett is a 86 y.o. female who presents for Medicare Annual (Subsequent) preventive examination.  Review of Systems: N/A      I connected with the patient today by telephone and verified that I am speaking with the correct person using two identifiers. Location patient: home Location nurse: work Persons participating in the telephone visit: patient, nurse.   I discussed the limitations, risks, security and privacy concerns of performing an evaluation and management service by telephone and the availability of in person appointments. I also discussed with the patient that there may be a patient responsible charge related to this service. The patient expressed understanding and verbally consented to this telephonic visit.        Cardiac Risk Factors include: advanced age (>62mn, >>89women);diabetes mellitus;Other (see comment), Risk factor comments: hypercholesterolemia     Objective:    Today's Vitals   There is no height or weight on file to calculate BMI.  Advanced Directives 03/20/2020 09/26/2018 09/24/2018 05/13/2016  Does Patient Have a Medical Advance Directive? Yes Yes Yes Yes  Type of [place] Living will;Healthcare Power of [place] will  Does patient want to make changes to medical advance directive? - No - Patient declined No - Patient declined No - Patient declined  Copy of [place]? Yes - validated most recent copy scanned in chart (See row information) No - copy requested - No - copy requested  Would patient like information on creating a medical advance directive? - No - Patient declined No - Patient declined -    Current Medications (verified) Outpatient Encounter Medications as of 03/20/2020  Medication Sig  . Blood Glucose Monitoring Suppl (CONTOUR BLOOD GLUCOSE SYSTEM) W/DEVICE KIT Use to check blood sugar once daily   .  Cholecalciferol (VITAMIN D3) 50 MCG (2000 UT) capsule Take 2,000 Units by mouth daily.  .Mariane BaumgartenCalcium (STOOL SOFTENER PO) Take 1 tablet by mouth daily as needed.  . ezetimibe (ZETIA) 10 MG tablet Take 1 tablet (10 mg total) by mouth daily.  .Marland Kitchenglucose blood (BAYER CONTOUR TEST) test strip 1 each by Other route daily. Use as instructed   . losartan (COZAAR) 50 MG tablet Take 1 tablet (50 mg total) by mouth daily.   No facility-administered encounter medications on file as of 03/20/2020.    Allergies (verified) Etodolac, Ace inhibitors, Atorvastatin, Glimepiride, Glipizide, Metformin, Metoprolol tartrate, Propoxyphene n-acetaminophen, and Rosuvastatin   History: Past Medical History:  Diagnosis Date  . Anemia   . Anemia   . Diabetes mellitus   . DM2 (diabetes mellitus, type 2) ([place]   . HTN (hypertension)   . Kidney congenitally absent, left    small  . Macular degeneration   . Macular degeneration   . Nephrolithiasis    hx  . Renal insufficiency    poss renal vasc dz  . Renal insufficiency   . Rheumatoid arthritis(714.0)   . Rheumatoid arthritis(714.0)   . Umbilical hernia   . Urinary incontinence    Past Surgical History:  Procedure Laterality Date  . 411 echo     mild diastolic dysfunction EF 677% slt pulm HTN   . cataract signs     Family History  Problem Relation Age of Onset  . Heart attack Mother   . Prostate cancer Father    Social History   Socioeconomic History  . Marital status: Widowed    Spouse name:  Not on file  . Number of children: Not on file  . Years of education: Not on file  . Highest education level: Not on file  Occupational History  . Not on file  Tobacco Use  . Smoking status: Never Smoker  . Smokeless tobacco: Never Used  Substance and Sexual Activity  . Alcohol use: No    Alcohol/week: 0.0 standard drinks  . Drug use: No  . Sexual activity: Never  Other Topics Concern  . Not on file  Social History Narrative   Cares for  husband at home who is also diabetic.    Social Determinants of Health   Financial Resource Strain: Low Risk   . Difficulty of Paying Living Expenses: Not hard at all  Food Insecurity: No Food Insecurity  . Worried About Charity fundraiser in the Last Year: Never true  . Ran Out of Food in the Last Year: Never true  Transportation Needs: No Transportation Needs  . Lack of Transportation (Medical): No  . Lack of Transportation (Non-Medical): No  Physical Activity: Inactive  . Days of Exercise per Week: 0 days  . Minutes of Exercise per Session: 0 min  Stress: No Stress Concern Present  . Feeling of Stress : Not at all  Social Connections:   . Frequency of Communication with Friends and Family: Not on file  . Frequency of Social Gatherings with Friends and Family: Not on file  . Attends Religious Services: Not on file  . Active Member of Clubs or Organizations: Not on file  . Attends Archivist Meetings: Not on file  . Marital Status: Not on file    Tobacco Counseling Counseling given: Not Answered   Clinical Intake:  Pre-visit preparation completed: Yes  Pain : No/denies pain     Nutritional Risks: None Diabetes: Yes CBG done?: No Did pt. bring in CBG monitor from home?: No  How often do you need to have someone help you when you read instructions, pamphlets, or other written materials from your doctor or pharmacy?: 1 - Never What is the last grade level you completed in school?: 12th  Diabetic: yes  Nutrition Risk Assessment:  Has the patient had any N/V/D within the last 2 months?  No  Does the patient have any non-healing wounds?  No  Has the patient had any unintentional weight loss or weight gain?  No   Diabetes:  Is the patient diabetic?  Yes  If diabetic, was a CBG obtained today?  No  Did the patient bring in their glucometer from home?  No  How often do you monitor your CBG's? Twice a week.   Financial Strains and Diabetes  Management:  Are you having any financial strains with the device, your supplies or your medication? No .  Does the patient want to be seen by Chronic Care Management for management of their diabetes?  No  Would the patient like to be referred to a Nutritionist or for Diabetic Management?  No   Diabetic Exams:  Diabetic Eye Exam: declined Diabetic Foot Exam: Overdue, Pt has been advised about the importance in completing this exam. Pt is scheduled for diabetic foot exam on 03/27/2020.   Interpreter Needed?: No  Information entered by :: CJohnson, LPN   Activities of Daily Living In your present state of health, do you have any difficulty performing the following activities: 03/20/2020  Hearing? N  Vision? Y  Comment has macular degeneration  Difficulty concentrating or making decisions? Darreld Mclean  Comment Patient has dementia.  Walking or climbing stairs? N  Dressing or bathing? Y  Doing errands, shopping? Y  Preparing Food and eating ? Y  Using the Toilet? Y  In the past six months, have you accidently leaked urine? Y  Comment wears pads  Do you have problems with loss of bowel control? N  Managing your Medications? Y  Managing your Finances? Y  Housekeeping or managing your Housekeeping? Y  Some recent data might be hidden    Patient Care Team: Tower, Wynelle Fanny, MD as PCP - General Birder Robson, MD as Referring Physician (Ophthalmology) Vogler, Michell Heinrich, DPM as Referring Physician (Podiatry) Rosiland Oz, PA-C as Referring Physician (Physician Assistant)  Indicate any recent Medical Services you may have received from other than [facility] providers in the past year (date may be approximate).     Assessment:   This is a routine wellness examination for Caylynn.  Hearing/Vision screen  Hearing Screening   125Hz  250Hz  500Hz  1000Hz  2000Hz  3000Hz  4000Hz  6000Hz  8000Hz   Right ear:           Left ear:           Vision Screening Comments: Patient declined eye exams. Has macular  degeneration.   Dietary issues and exercise activities discussed: Current Exercise Habits: The patient does not participate in regular exercise at present, Exercise limited by: None identified  Goals    . other     Starting 05/13/2016, I will begin to focus more on details in an effort to improve recall.     . Patient Stated     03/20/2020, I will continue to maintain and continue medications as prescribed.      Depression Screen PHQ 2/9 Scores 03/20/2020 06/22/2017 05/13/2016 05/07/2015 05/01/2014 11/02/2013 06/27/2012  PHQ - 2 Score 0 0 0 0 0 0 0  PHQ- 9 Score 0 - - - - - -    Fall Risk Fall Risk  03/20/2020 06/22/2017 05/13/2016 05/07/2015 05/01/2014  Falls in the past year? 0 No No No No  Number falls in past yr: 0 - - - -  Injury with Fall? 0 - - - -  Risk for fall due to : Impaired mobility;Impaired balance/gait;Medication side effect;Impaired vision;Mental status change - - - -  Follow up Falls evaluation completed;Falls prevention discussed - - - -    Any stairs in or around the home? Yes  If so, are there any without handrails? No  Home free of loose throw rugs in walkways, pet beds, electrical cords, etc? Yes  Adequate lighting in your home to reduce risk of falls? Yes   ASSISTIVE DEVICES UTILIZED TO PREVENT FALLS:  Life alert? No  Use of a cane, walker or w/c? Yes  Grab bars in the bathroom? Yes  Shower chair or bench in shower? Yes  Elevated toilet seat or a handicapped toilet? Yes   TIMED UP AND GO:  Was the test performed? N/A, telephonic visit.  Cognitive Function: MMSE - Mini Mental State Exam 03/20/2020 05/13/2016  Not completed: Unable to complete -  Orientation to time - 5  Orientation to Place - 5  Registration - 3  Attention/ Calculation - 0  Recall - 1  Recall-comments - pt was unable to recall 2 of 3 words  Language- name 2 objects - 0  Language- repeat - 1  Language- follow 3 step command - 3  Language- read & follow direction - 0  Write a sentence  - 0  Copy design -  0  Total score - 18  Mini Cog  Mini-Cog screen was completed. Maximum score is 22. A value of 0 denotes this part of the MMSE was not completed or the patient failed this part of the Mini-Cog screening.       Immunizations Immunization History  Administered Date(s) Administered  . Fluad Quad(high Dose 65+) 05/02/2019  . Influenza Split 05/19/2011, 05/17/2012  . Influenza Whole 05/05/2006, 05/20/2007, 04/16/2008, 05/14/2009, 04/30/2010  . Influenza, High Dose Seasonal PF 05/12/2018  . Influenza,inj,Quad PF,6+ Mos 03/29/2013, 05/01/2014, 05/07/2015, 05/13/2016, 05/18/2017  . PFIZER SARS-COV-2 Vaccination 09/18/2019, 10/10/2019  . Pneumococcal Conjugate-13 05/01/2014  . Pneumococcal Polysaccharide-23 05/10/1996, 12/05/2008  . Tdap 06/12/2011    TDAP status: Up to date Flu Vaccine status: due, will get next week at physical  Pneumococcal vaccine status: Up to date Covid-19 vaccine status: Completed vaccines  Qualifies for Shingles Vaccine? Yes   Zostavax completed No   Shingrix Completed?: No.    Education has been provided regarding the importance of this vaccine. Patient has been advised to call insurance company to determine out of pocket expense if they have not yet received this vaccine. Advised may also receive vaccine at local pharmacy or Health Dept. Verbalized acceptance and understanding.  Screening Tests Health Maintenance  Topic Date Due  . FOOT EXAM  01/17/2018  . HEMOGLOBIN A1C  10/31/2019  . INFLUENZA VACCINE  02/18/2020  . OPHTHALMOLOGY EXAM  08/21/2020 (Originally 10/31/2014)  . MAMMOGRAM  06/27/2021 (Originally 05/20/2002)  . DEXA SCAN  05/13/2026 (Originally 08/26/1997)  . TETANUS/TDAP  06/11/2021  . COVID-19 Vaccine  Completed  . PNA vac Low Risk Adult  Completed    Health Maintenance  Health Maintenance Due  Topic Date Due  . FOOT EXAM  01/17/2018  . HEMOGLOBIN A1C  10/31/2019  . INFLUENZA VACCINE  02/18/2020    Colorectal cancer  screening: No longer required.  Mammogram status: declined Bone Density status: declined  Lung Cancer Screening: (Low Dose CT Chest recommended if Age 42-80 years, 30 pack-year currently smoking OR have quit w/in 15 years.) does not qualify.    Additional Screening:  Hepatitis C Screening: does not qualify; Completed N/A  Vision Screening: Recommended annual ophthalmology exams for early detection of glaucoma and other disorders of the eye. Is the patient up to date with their annual eye exam?  No , declined Who is the provider or what is the name of the office in which the patient attends annual eye exams? Declined, Patient no longer does eye exams If pt is not established with a provider, would they like to be referred to a provider to establish care? No .   Dental Screening: Recommended annual dental exams for proper oral hygiene  Community Resource Referral / Chronic Care Management: CRR required this visit?  No   CCM required this visit?  No      Plan:     I have personally reviewed and noted the following in the patient's chart:   . Medical and social history . Use of alcohol, tobacco or illicit drugs  . Current medications and supplements . Functional ability and status . Nutritional status . Physical activity . Advanced directives . List of other physicians . Hospitalizations, surgeries, and ER visits in previous 12 months . Vitals . Screenings to include cognitive, depression, and falls . Referrals and appointments  In addition, I have reviewed and discussed with patient certain preventive protocols, quality metrics, and best practice recommendations. A written personalized care plan for preventive services  as well as general preventive health recommendations were provided to patient.   Due to this being a telephonic visit, the after visit summary with patients personalized plan was offered to patient via mail or my-chart.  Patient preferred to pick up at office  at next visit.   Andrez Grime, LPN   02/25/3733

## 2020-03-20 NOTE — Progress Notes (Signed)
PCP notes:  Health Maintenance: Flu- due Foot exam- due   Abnormal Screenings: none   Patient concerns: none   Nurse concerns: none   Next PCP appt.: 03/27/2020 @ 2 pm

## 2020-03-27 ENCOUNTER — Encounter: Payer: Self-pay | Admitting: Family Medicine

## 2020-03-27 ENCOUNTER — Other Ambulatory Visit: Payer: Self-pay

## 2020-03-27 ENCOUNTER — Ambulatory Visit (INDEPENDENT_AMBULATORY_CARE_PROVIDER_SITE_OTHER): Payer: Medicare Other | Admitting: Family Medicine

## 2020-03-27 VITALS — BP 131/80 | HR 73 | Temp 96.9°F | Wt 172.2 lb

## 2020-03-27 DIAGNOSIS — N289 Disorder of kidney and ureter, unspecified: Secondary | ICD-10-CM

## 2020-03-27 DIAGNOSIS — E1121 Type 2 diabetes mellitus with diabetic nephropathy: Secondary | ICD-10-CM

## 2020-03-27 DIAGNOSIS — Z23 Encounter for immunization: Secondary | ICD-10-CM

## 2020-03-27 DIAGNOSIS — Z0000 Encounter for general adult medical examination without abnormal findings: Secondary | ICD-10-CM

## 2020-03-27 DIAGNOSIS — E785 Hyperlipidemia, unspecified: Secondary | ICD-10-CM

## 2020-03-27 DIAGNOSIS — I739 Peripheral vascular disease, unspecified: Secondary | ICD-10-CM

## 2020-03-27 DIAGNOSIS — E1142 Type 2 diabetes mellitus with diabetic polyneuropathy: Secondary | ICD-10-CM

## 2020-03-27 DIAGNOSIS — E1122 Type 2 diabetes mellitus with diabetic chronic kidney disease: Secondary | ICD-10-CM | POA: Diagnosis not present

## 2020-03-27 DIAGNOSIS — D696 Thrombocytopenia, unspecified: Secondary | ICD-10-CM

## 2020-03-27 DIAGNOSIS — I10 Essential (primary) hypertension: Secondary | ICD-10-CM

## 2020-03-27 DIAGNOSIS — Z6832 Body mass index (BMI) 32.0-32.9, adult: Secondary | ICD-10-CM

## 2020-03-27 DIAGNOSIS — L84 Corns and callosities: Secondary | ICD-10-CM

## 2020-03-27 DIAGNOSIS — E6609 Other obesity due to excess calories: Secondary | ICD-10-CM

## 2020-03-27 DIAGNOSIS — R413 Other amnesia: Secondary | ICD-10-CM

## 2020-03-27 DIAGNOSIS — E1169 Type 2 diabetes mellitus with other specified complication: Secondary | ICD-10-CM

## 2020-03-27 LAB — COMPREHENSIVE METABOLIC PANEL
ALT: 10 U/L (ref 0–35)
AST: 16 U/L (ref 0–37)
Albumin: 4.1 g/dL (ref 3.5–5.2)
Alkaline Phosphatase: 71 U/L (ref 39–117)
BUN: 24 mg/dL — ABNORMAL HIGH (ref 6–23)
CO2: 30 mEq/L (ref 19–32)
Calcium: 9.7 mg/dL (ref 8.4–10.5)
Chloride: 102 mEq/L (ref 96–112)
Creatinine, Ser: 0.96 mg/dL (ref 0.40–1.20)
GFR: 54.9 mL/min — ABNORMAL LOW (ref >60.00)
Glucose, Bld: 115 mg/dL — ABNORMAL HIGH (ref 70–99)
Potassium: 4.6 mEq/L (ref 3.5–5.1)
Sodium: 139 mEq/L (ref 135–145)
Total Bilirubin: 0.9 mg/dL (ref 0.2–1.2)
Total Protein: 6.8 g/dL (ref 6.0–8.3)

## 2020-03-27 LAB — LIPID PANEL
Cholesterol: 209 mg/dL — ABNORMAL HIGH (ref 0–200)
HDL: 57 mg/dL (ref >39.00)
LDL Cholesterol: 134 mg/dL — ABNORMAL HIGH (ref 0–99)
NonHDL: 151.5
Total CHOL/HDL Ratio: 4
Triglycerides: 89 mg/dL (ref 0.0–149.0)
VLDL: 17.8 mg/dL (ref 0.0–40.0)

## 2020-03-27 LAB — CBC WITH DIFFERENTIAL/PLATELET
Basophils Absolute: 0 10*3/uL (ref 0.0–0.1)
Basophils Relative: 0.6 % (ref 0.0–3.0)
Eosinophils Absolute: 0.1 10*3/uL (ref 0.0–0.7)
Eosinophils Relative: 2.5 % (ref 0.0–5.0)
HCT: 37.2 % (ref 36.0–46.0)
Hemoglobin: 12.8 g/dL (ref 12.0–15.0)
Lymphocytes Relative: 26.2 % (ref 12.0–46.0)
Lymphs Abs: 1.4 10*3/uL (ref 0.7–4.0)
MCHC: 34.3 g/dL (ref 30.0–36.0)
MCV: 92.1 fl (ref 78.0–100.0)
Monocytes Absolute: 0.5 10*3/uL (ref 0.1–1.0)
Monocytes Relative: 9.5 % (ref 3.0–12.0)
Neutro Abs: 3.4 10*3/uL (ref 1.4–7.7)
Neutrophils Relative %: 61.2 % (ref 43.0–77.0)
Platelets: 137 10*3/uL — ABNORMAL LOW (ref 150.0–400.0)
RBC: 4.04 Mil/uL (ref 3.87–5.11)
RDW: 13.5 % (ref 11.5–15.5)
WBC: 5.5 10*3/uL (ref 4.0–10.5)

## 2020-03-27 LAB — HEMOGLOBIN A1C: Hgb A1c MFr Bld: 5.9 % (ref 4.6–6.5)

## 2020-03-27 LAB — TSH: TSH: 1.32 u[IU]/mL (ref 0.35–4.50)

## 2020-03-27 MED ORDER — LOSARTAN POTASSIUM 50 MG PO TABS: 50.0000 mg | ORAL_TABLET | Freq: Every day | ORAL | 3 refills | Status: AC

## 2020-03-27 MED ORDER — EZETIMIBE 10 MG PO TABS: 10.0000 mg | ORAL_TABLET | Freq: Every day | ORAL | 3 refills | Status: AC

## 2020-03-27 NOTE — Assessment & Plan Note (Signed)
Lab today  Encouraged better fluid intake

## 2020-03-27 NOTE — Assessment & Plan Note (Signed)
Discussed how this problem influences overall health and the risks it imposes  Reviewed plan for weight loss with lower calorie diet (via better food choices and also portion control or program like weight watchers) and exercise building up to or more than 30 minutes 5 days per week including some aerobic activity    

## 2020-03-27 NOTE — Assessment & Plan Note (Signed)
R distal 2nd toe remains the same Clean/protected and no signs of infection She continues to f/u with podiatry

## 2020-03-27 NOTE — Assessment & Plan Note (Signed)
No clinical changes Doing ok off gabapentin

## 2020-03-27 NOTE — Assessment & Plan Note (Signed)
Lab today  Taking arb

## 2020-03-27 NOTE — Assessment & Plan Note (Signed)
Cbc today  Mild/borderline in the past

## 2020-03-27 NOTE — Progress Notes (Signed)
Subjective:    Patient ID: Kristina Bennett, female    DOB: 1931-03-06, 86 y.o.   MRN: [id number]  HPI Here for health maintenance exam and to review chronic medical problems    Had amw on 9/1 Noted flu shot is due No gaps  Wt Readings from Last 3 Encounters:  03/27/20 172 lb 3 oz (78.1 kg)  02/14/19 170 lb (77.1 kg)  10/31/18 171 lb (77.6 kg)   33.91 kg/m   Feels ok  No big changes  Not doing a lot  Staying home    Flu shot today - got it  covid immunized  Zoster status -considers shingrix if covered   Had shingles last sept.   Eye exam -declines  Macular degeneration-has not noticed a big changes  Is able to do word searches   Declines mammogram  Self breast exam - no lumps noted   Declines dexa [place] - none fx -none  Supplements -taking vit D  Exercise -not much exercise (gets around with a walker) in the house  Tries to move as much as she can  Some knee issues   HTN bp is stable today  No cp or palpitations or headaches or edema  No side effects to medicines  BP Readings from Last 3 Encounters:  03/27/20 (!) 142/68  02/14/19 135/71  10/31/18 (!) 150/78     Pulse Readings from Last 3 Encounters:  03/27/20 73  02/14/19 67  09/29/18 82    DM2 with nephropathy and neuropathy  Lab Results  Component Value Date   HGBA1C 6.0 05/02/2019   Has been well controlled  Overdue for labs  On arb Statin intolerant  Thinks her a1c will be up a bit-eating differently /eating more now and regularly  3 meals per day  Still some sweets once in a while   Neuropathy- everything feels like sand    H/o renal insuff  Lab Results  Component Value Date   CREATININE 0.93 05/02/2019   BUN 23 05/02/2019   NA 138 05/02/2019   K 4.4 05/02/2019   CL 102 05/02/2019   CO2 30 05/02/2019  also anemia of chronic dz   Taking zetia for hyperlipidemia  Intolerant of statins  Unsure if she would be open to intermittent dosing   Memory- short term recall is worse   Not too problematic  No big safety concerns No wandering or behavioral change   Patient Active Problem List   Diagnosis Date Noted  . Thrombocytopenia ([place]) 03/27/2020  . PVD (peripheral vascular disease) ([place]) 02/14/2019  . Constipation 10/31/2018  . Osteoarthritis of left hip 10/31/2018  . Short-term memory loss 10/31/2018  . Acute on chronic kidney failure ([place]) 09/25/2018  . Elevated troponin 09/25/2018  . Obesity 11/05/2015  . Routine general medical examination at a health care facility 05/07/2015  . Encounter for Medicare annual wellness exam 05/01/2014  . Pre-ulcerative calluses 11/01/2013  . Pedal edema 11/14/2009  . Renal insufficiency 09/24/2009  . VARICOSE VEINS, LOWER EXTREMITIES 12/05/2008  . Anemia 07/05/2007  . Diabetes type 2, controlled ([place]) 04/08/2007  . Diabetic nephropathy ([place]) 04/08/2007  . Diabetic neuropathy ([place]) 04/08/2007  . Hyperlipidemia associated with type 2 diabetes mellitus ([place]) 04/08/2007  . Macular degeneration (senile) of retina 04/08/2007  . Essential hypertension 04/08/2007  . URINARY INCONTINENCE 04/08/2007  . NEPHROLITHIASIS, HX OF 04/08/2007   Past Medical History:  Diagnosis Date  . Anemia   . Anemia   . Diabetes mellitus   . DM2 (diabetes  mellitus, type 2) ([place])   . HTN (hypertension)   . Kidney congenitally absent, left    small  . Macular degeneration   . Macular degeneration   . Nephrolithiasis    hx  . Renal insufficiency    poss renal vasc dz  . Renal insufficiency   . Rheumatoid arthritis(714.0)   . Rheumatoid arthritis(714.0)   . Umbilical hernia   . Urinary incontinence    Past Surgical History:  Procedure Laterality Date  . 411 echo     mild diastolic dysfunction EF 29%, slt pulm HTN   . cataract signs     Social History   Tobacco Use  . Smoking status: Never Smoker  . Smokeless tobacco: Never Used  Substance Use Topics  . Alcohol use: No    Alcohol/week: 0.0 standard drinks  . Drug use: No    Family History  Problem Relation Age of Onset  . Heart attack Mother   . Prostate cancer Father    Allergies  Allergen Reactions  . Etodolac Shortness Of Breath and Other (See Comments)    REACTION: dizziness  . Ace Inhibitors Other (See Comments)    REACTION: unknown  . Atorvastatin Other (See Comments)    Reaction: muscle aches   . Glimepiride Other (See Comments)    Reaction: hypoglycemia  . Glipizide Other (See Comments)    REACTION: low sugar  . Metformin Other (See Comments)    REACTION: increased creatinine  . Metoprolol Tartrate Other (See Comments)    Reaction: unknown  . Propoxyphene N-Acetaminophen Other (See Comments)    Reaction: unknown  . Rosuvastatin Other (See Comments)    Reaction: muscle aches    Current Outpatient Medications on File Prior to Visit  Medication Sig Dispense Refill  . Blood Glucose Monitoring Suppl (CONTOUR BLOOD GLUCOSE SYSTEM) W/DEVICE KIT Use to check blood sugar once daily     . Cholecalciferol (VITAMIN D3) 50 MCG (2000 UT) capsule Take 2,000 Units by mouth daily.    Mariane Baumgarten Calcium (STOOL SOFTENER PO) Take 1 tablet by mouth daily as needed.    Marland Kitchen glucose blood (BAYER CONTOUR TEST) test strip 1 each by Other route daily. Use as instructed      No current facility-administered medications on file prior to visit.    Review of Systems  Constitutional: Positive for fatigue. Negative for activity change, appetite change, fever and unexpected weight change.  HENT: Negative for congestion, ear pain, rhinorrhea, sinus pressure and sore throat.   Eyes: Negative for pain, redness and visual disturbance.  Respiratory: Negative for cough, shortness of breath and wheezing.   Cardiovascular: Negative for chest pain, palpitations and leg swelling.  Gastrointestinal: Negative for abdominal pain, blood in stool, constipation and diarrhea.       Umbilical hernia  Reducible  Not painful  Endocrine: Negative for polydipsia and polyuria.   Genitourinary: Negative for dysuria, frequency and urgency.  Musculoskeletal: Positive for arthralgias, back pain and gait problem. Negative for joint swelling and myalgias.  Skin: Negative for pallor and rash.  Allergic/Immunologic: Negative for environmental allergies.  Neurological: Negative for dizziness, syncope, light-headedness and headaches.       Poor balance  Uses walker for all ambulation  Hematological: Negative for adenopathy. Does not bruise/bleed easily.  Psychiatric/Behavioral: Negative for decreased concentration and dysphoric mood. The patient is nervous/anxious.        Objective:   Physical Exam Constitutional:      General: She is not in acute distress.  Appearance: Normal appearance. She is well-developed. She is obese. She is not ill-appearing or diaphoretic.  HENT:     Head: Normocephalic and atraumatic.     Right Ear: Tympanic membrane, ear canal and external ear normal.     Left Ear: Tympanic membrane, ear canal and external ear normal.     Nose: Nose normal. No congestion.     Mouth/Throat:     Mouth: Mucous membranes are moist.     Pharynx: Oropharynx is clear. No posterior oropharyngeal erythema.  Eyes:     General: No scleral icterus.    Extraocular Movements: Extraocular movements intact.     Conjunctiva/sclera: Conjunctivae normal.     Pupils: Pupils are equal, round, and reactive to light.  Neck:     Thyroid: No thyromegaly.     Vascular: No carotid bruit or JVD.  Cardiovascular:     Rate and Rhythm: Normal rate and regular rhythm.     Heart sounds: Murmur heard.  No gallop.      Comments: Difficult to palp pedal pulses (feet are warm) Pulmonary:     Effort: Pulmonary effort is normal. No respiratory distress.     Breath sounds: Normal breath sounds. No stridor. No wheezing.     Comments: Good air exch Chest:     Chest wall: No tenderness.  Abdominal:     General: Bowel sounds are normal. There is no distension or abdominal bruit.      Palpations: Abdomen is soft. There is no mass.     Tenderness: There is no abdominal tenderness. There is no right CVA tenderness or left CVA tenderness.     Hernia: No hernia is present.     Comments: Baseline umbilical hernia nontender reducible  Genitourinary:    Comments: Breast exam: No mass, nodules, thickening, tenderness, bulging, retraction, inflamation, nipple discharge or skin changes noted.  No axillary or clavicular LA.     Musculoskeletal:        General: No tenderness. Normal range of motion.     Cervical back: Normal range of motion and neck supple. No rigidity. No muscular tenderness.     Right lower leg: No edema.     Left lower leg: No edema.     Comments: Mild kyphosis   Lymphadenopathy:     Cervical: No cervical adenopathy.  Skin:    General: Skin is warm and dry.     Coloration: Skin is not pale.     Findings: No erythema or rash.  Neurological:     Mental Status: She is alert. Mental status is at baseline.     Cranial Nerves: No cranial nerve deficit.     Motor: No abnormal muscle tone.     Coordination: Coordination normal.     Gait: Gait normal.     Deep Tendon Reflexes: Reflexes are normal and symmetric. Reflexes normal.  Psychiatric:        Mood and Affect: Mood normal.        Cognition and Memory: Cognition normal. Memory is impaired.           Assessment & Plan:   Problem List Items Addressed This Visit      Cardiovascular and Mediastinum   Essential hypertension    bp in fair control at this time  BP Readings from Last 1 Encounters:  03/27/20 131/80   No changes needed Most recent labs reviewed  Disc lifstyle change with low sodium diet and exercise  Labs today      Relevant  Medications   ezetimibe (ZETIA) 10 MG tablet   losartan (COZAAR) 50 MG tablet   Other Relevant Orders   CBC with Differential/Platelet (Completed)   Comprehensive metabolic panel (Completed)   Lipid panel (Completed)   TSH (Completed)   PVD (peripheral  vascular disease) (HCC)    No clinical changes Lipid panel today  Declines statin -intolerant      Relevant Medications   ezetimibe (ZETIA) 10 MG tablet   losartan (COZAAR) 50 MG tablet     Endocrine   Diabetes type 2, controlled (HCC)    A1C today  Eating too many sweets -may be up  Statin intol-asked to consider small dose twice weekly  Taking zetia  On arb Declines eye exam      Relevant Medications   losartan (COZAAR) 50 MG tablet   Other Relevant Orders   Hemoglobin A1c (Completed)   Diabetic nephropathy (HCC)    Lab today  Taking arb      Relevant Medications   losartan (COZAAR) 50 MG tablet   Diabetic neuropathy (HCC)    No clinical changes Doing ok off gabapentin      Relevant Medications   losartan (COZAAR) 50 MG tablet   Hyperlipidemia associated with type 2 diabetes mellitus (HCC)    Pt is entirely statin intol Lab today  Taking zetia Fair diet  Disc imp of statin with DM- asked to consider small dose twice weekly      Relevant Medications   ezetimibe (ZETIA) 10 MG tablet   losartan (COZAAR) 50 MG tablet     Musculoskeletal and Integument   Pre-ulcerative calluses    R distal 2nd toe remains the same Clean/protected and no signs of infection She continues to f/u with podiatry         Genitourinary   Renal insufficiency    Lab today  Encouraged better fluid intake        Other   Routine general medical examination at a health care facility - Primary    Reviewed health habits including diet and exercise and skin cancer prevention Reviewed appropriate screening tests for age  Also reviewed health mt list, fam hx and immunization status , as well as social and family history   See HPI Labs reviewed amw reviewed  Flu shot given Discussed shingrix vaccine Is covid immunized Declines breast cancer screening and eye exams and dexa (no falls or fractures) Labs today       Relevant Orders   Flu Vaccine QUAD High Dose(Fluad)  (Completed)   Obesity    Discussed how this problem influences overall health and the risks it imposes  Reviewed plan for weight loss with lower calorie diet (via better food choices and also portion control or program like weight watchers) and exercise building up to or more than 30 minutes 5 days per week including some aerobic activity         Short-term memory loss    Gradually worsening per family but not causing problems  Fairly sharp today      Thrombocytopenia ([place])    Cbc today  Mild/borderline in the past       Other Visit Diagnoses    Need for influenza vaccination       Relevant Orders   Flu Vaccine QUAD High Dose(Fluad) (Completed)

## 2020-03-27 NOTE — Assessment & Plan Note (Signed)
bp in fair control at this time  BP Readings from Last 1 Encounters:  03/27/20 131/80   No changes needed Most recent labs reviewed  Disc lifstyle change with low sodium diet and exercise  Labs today

## 2020-03-27 NOTE — Assessment & Plan Note (Signed)
Pt is entirely statin intol Lab today  Taking zetia Fair diet  Disc imp of statin with DM- asked to consider small dose twice weekly

## 2020-03-27 NOTE — Assessment & Plan Note (Signed)
No clinical changes Lipid panel today  Declines statin -intolerant

## 2020-03-27 NOTE — Assessment & Plan Note (Signed)
A1C today  Eating too many sweets -may be up  Statin intol-asked to consider small dose twice weekly  Taking zetia  On arb Declines eye exam

## 2020-03-27 NOTE — Assessment & Plan Note (Signed)
Gradually worsening per family but not causing problems  Fairly sharp today

## 2020-03-27 NOTE — Assessment & Plan Note (Signed)
Reviewed health habits including diet and exercise and skin cancer prevention Reviewed appropriate screening tests for age  Also reviewed health mt list, fam hx and immunization status , as well as social and family history   See HPI Labs reviewed amw reviewed  Flu shot given Discussed shingrix vaccine Is covid immunized Declines breast cancer screening and eye exams and dexa (no falls or fractures) Labs today

## 2020-03-27 NOTE — Patient Instructions (Addendum)
If you are interested in the new shingles vaccine (Shingrix) - call your local pharmacy to check on coverage and availability  If affordable, get on a wait list at your pharmacy to get the vaccine.  Flu shot today  Labs today   Stay as active as you can safely be  Eat a balanced diet  Keep your brain active   Try to get most of your carbohydrates from produce (with the exception of white potatoes)  Eat less bread/pasta/rice/snack foods/cereals/sweets and other items from the middle of the grocery store (processed carbs)

## 2022-07-09 ENCOUNTER — Telehealth: Payer: Self-pay

## 2022-07-09 NOTE — Telephone Encounter (Signed)
Transition Care Management Unsuccessful Follow-up Telephone Call  Date of [facility] and from where:  [facility]  Attempts:  1st Attempt  Reason for unsuccessful TCM follow-up call:  Left voice message Karena Addison, LPN [facility] Nurse Health Advisor Direct Dial [phone]

## 2022-07-10 NOTE — Telephone Encounter (Signed)
Transition Care Management Unsuccessful Follow-up Telephone Call  Date of discharge and from where:  [facility]  Attempts:  2nd Attempt  Reason for unsuccessful TCM follow-up call:  Left voice message Karena Addison, LPN [facility] Nurse Health Advisor Direct Dial [phone]

## 2022-07-14 ENCOUNTER — Telehealth: Payer: Self-pay | Admitting: Family Medicine

## 2022-07-14 NOTE — Telephone Encounter (Signed)
Patient daughter Kristina Bennett called in and stated that she will no longer be seeing Dr. Milinda Antis. She is in an assisted living in memory care for her dementia. Thank you!

## 2022-07-14 NOTE — Telephone Encounter (Signed)
Dr. Tower removed from PCP 

## 2022-07-15 NOTE — Telephone Encounter (Signed)
Patient in [place] now

## 2022-08-10 ENCOUNTER — Telehealth: Payer: Self-pay | Admitting: Family Medicine

## 2022-08-10 NOTE — Telephone Encounter (Signed)
This will need to come from her current pcp Thanks

## 2022-08-10 NOTE — Telephone Encounter (Signed)
Looks like pt hasn't been seen since 2021 and last time we tried to get her an appt daughter advised Dr. Glori Bickers is no longer PCP and she has been seeing provider with [facility] (in care everywhere)

## 2022-08-10 NOTE — Telephone Encounter (Signed)
Patient daughter Kristina Bennett called in and stated that Kristina Bennett was one a patient of Dr. Alba Cory. She stated that she is needing a letter stating that Kristina Bennett is incompetent to handle her personal and business affairs. They are trying to sale her house to afford her care at the memory care facility she is in. She stated that letter can be sent over to: [email or web address] or faxed over to [phone]. Kristina Bennett can be reached at [phone]. Please advise. Thank you!

## 2022-08-10 NOTE — Telephone Encounter (Signed)
Patient called and stated she was returning a call back.

## 2022-08-10 NOTE — Telephone Encounter (Signed)
Left message to return call to our office.  

## 2022-08-11 NOTE — Telephone Encounter (Signed)
Advised daughter of Dr. Marliss Coots comments. Daughter said that pt's PCP did do a letter but in order to have it legal for her POA she has to have 2 letters stating this information. She is still requesting Dr. Glori Bickers to write a letter since she did see her for many years. She asked if she didn't feel comfortable writing letter since we have not seen her since 2021 if she could schedule a virtual visit to discuss this with Dr. Glori Bickers directly. I advised her since she isn't considered a pt I can not schedule an appt with will route this back to Dr. Glori Bickers and management to see what they say regarding this.   Will route to PCP, Lavina Hamman and Caryl Pina for review

## 2022-08-11 NOTE — Telephone Encounter (Signed)
Called and spoke to daughter on [place]. Advised same information as given by [place]. She understood advised her to reach out to memory care they may be able to guide her in other options that will work. She agreed and will reach out to them.  No further action needed at this time.
